# Patient Record
Sex: Male | Born: 1955 | Race: Black or African American | Hispanic: No | Marital: Married | Smoking: Never smoker
Health system: Southern US, Community
[De-identification: ages and names within clinical notes are randomized; demographics above are authoritative.]

## PROBLEM LIST (undated history)

## (undated) DIAGNOSIS — I10 Essential (primary) hypertension: Secondary | ICD-10-CM

## (undated) HISTORY — PX: KNEE CARTILAGE SURGERY: SHX688

## (undated) HISTORY — PX: INNER EAR SURGERY: SHX679

---

## 2005-05-06 ENCOUNTER — Encounter: Admission: RE | Admit: 2005-05-06 | Discharge: 2005-05-06 | Payer: Self-pay | Admitting: Otolaryngology

## 2007-02-19 IMAGING — CT CT ORBIT/TEMPORAL/IAC W/O CM
4 of 8 series · 8 of 30 positions shown, 9 images · IV contrast (agent unspecified)
Comparison: None.

CLINICAL DATA: Three months right attic cholesteatoma. 
CT TEMPORAL BONE WITHOUT CONTRAST:
TECHNIQUE: Axial and coronal plane CT imaging of the petrous temporal bones was performed with thin-collimation image reconstruction.  No intravenous contrast was administered.

[Series 3: recon 2: axial/check for recons · axial · 0.19mm/px · z∈[-11,+9]mm · 2 of 96 slices shown]
[im 32/96  bone]
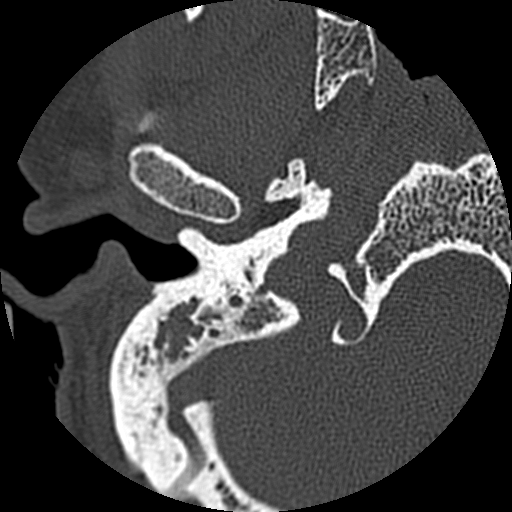
[im 64/96  bone]
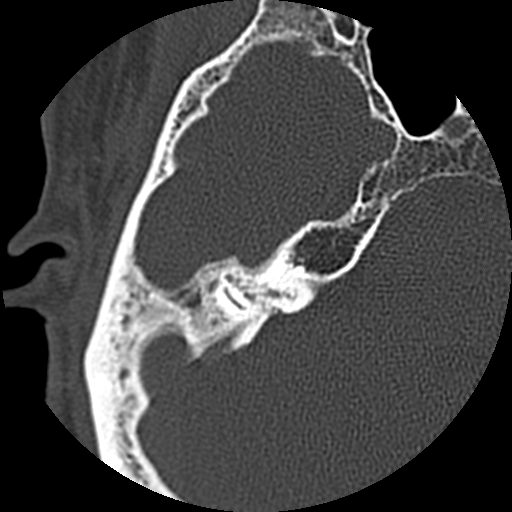

[Series 6: coronal/check recons 2 & 3 · axial · 0.33mm/px · z∈[+44,+67]mm · 2 of 96 slices shown, 3 images]
[im 32/96  brain]
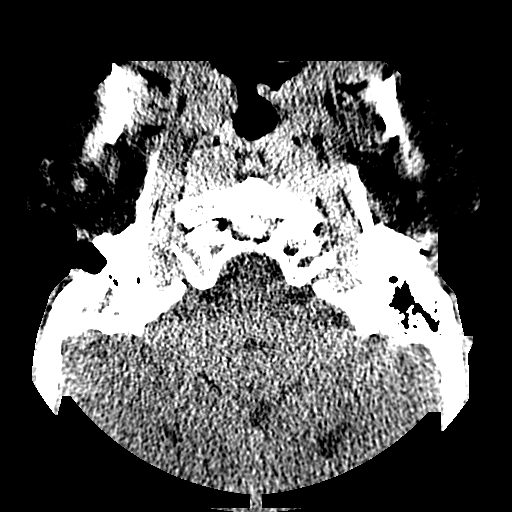
[im 32/96  bone]
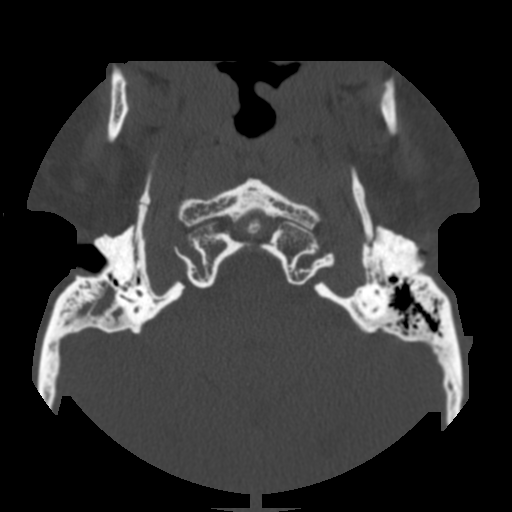
[im 64/96  bone]
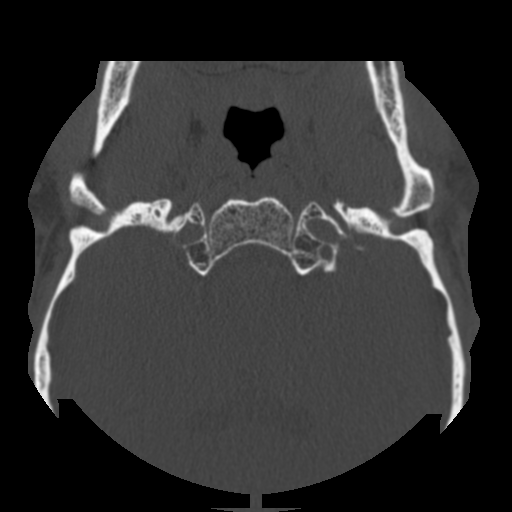

[Series 7: recon 2: coronal/check recons 2 · axial · 0.19mm/px · z∈[+62,+85]mm · 2 of 96 slices shown]
[im 32/96  bone]
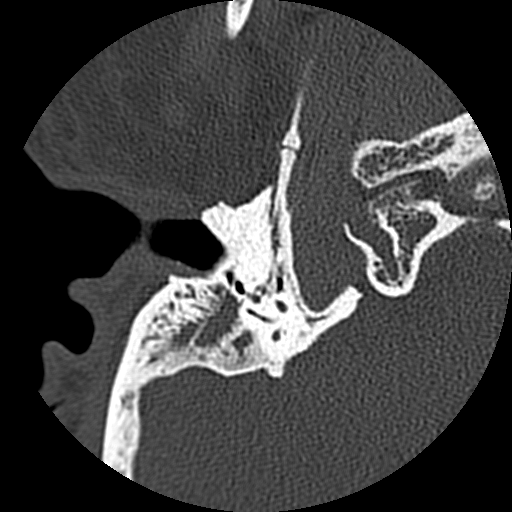
[im 64/96  bone]
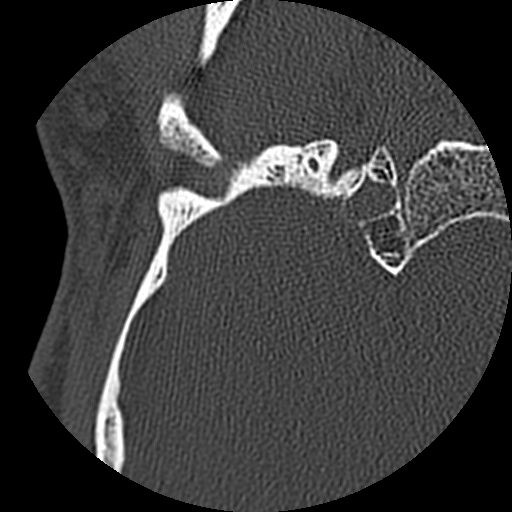

[Series 8: supine coronal · axial · 0.19mm/px · z∈[+63,+86]mm · 2 of 96 slices shown]
[im 32/96  bone]
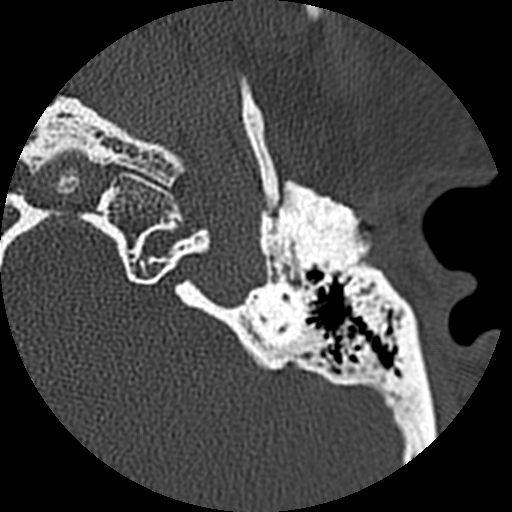
[im 64/96  bone]
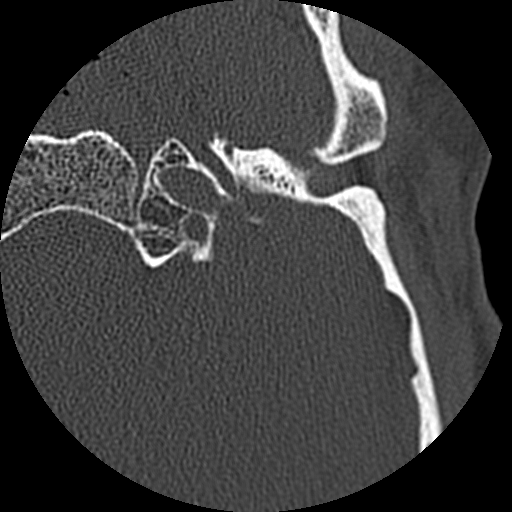

[8 of 30 positions shown; findings below may reference images not displayed]

FINDINGS: Abnormal soft tissue density is seen adjacent to the superolateral right ossicles which appear intact without demineralization.  There is slight osseous erosion at the anterior scutum with mucosal thickening in this region continuing to the adjacent external auditory canal consistent with slight changes of acquired cholesteatoma.  Soft tissue density continues confluently along the right aditus ad antrum with complete opacification of right mastoid air cells to include the mastoid antrum.  Tegmen appears intact with no other osseous erosion noted.  Specifically the right tympanic sinus appears clear.  Additional slight mucosal thickening is seen medially at the right middle ear cavity along the promontory.  The right tympanic membrane appears thickened superiorly.  Structures of the left petrous temporal bone, bilateral otic capsule structures, and bilateral descending facial nerve canals appear intact.  No other significant abnormality is seen.
IMPRESSION: Chronic right otomastoiditis with right anterior scutum osseous erosion consistent with acquired cholesteatoma.  No other osseous erosion is seen.

## 2008-07-29 ENCOUNTER — Ambulatory Visit (HOSPITAL_COMMUNITY): Admission: RE | Admit: 2008-07-29 | Discharge: 2008-07-29 | Payer: Self-pay | Admitting: *Deleted

## 2010-09-01 NOTE — Op Note (Signed)
NAMEMICAL, KICKLIGHTER                ACCOUNT NO.:  [id number]   MEDICAL RECORD NO.:  [id number]          PATIENT TYPE:  AMB   LOCATION:  ENDO                         FACILITY:  [facility]   PHYSICIAN:  Georgiana Spinner, M.D.    DATE OF BIRTH:  1955/04/29   DATE OF PROCEDURE:  DATE OF DISCHARGE:                               OPERATIVE REPORT   PROCEDURE:  Colonoscopy.   INDICATIONS:  Colon cancer screening.   ANESTHESIA:  Fentanyl 100 mcg, Versed 10 mg.   PROCEDURE:  With the patient mildly sedated in the left lateral  decubitus position a rectal exam was performed which was unremarkable,  but limited.  Subsequently, the Pentax videoscopic 3.2 colonoscope was  inserted in the rectum, passed under direct vision to the cecum  identified by ileocecal valve and appendiceal orifice, both of which  were photographed.  From this point, the colonoscope was slowly  withdrawn taking circumferential views of the colonic mucosa, stopping  in the rectum which appeared normal on direct and showed hemorrhoids on  retroflexed view.  The endoscope was straightened and withdrawn.  The  patient's vital signs and pulse oximeter remained stable.  The patient  tolerated the procedure well without apparent complication.   FINDINGS:  Hemorrhoids, but otherwise unremarkable examination.   PLAN:  To have the patient follow-up with me in 5 years or as needed.           ______________________________  Georgiana Spinner, M.D.     GMO/MEDQ  D:  07/29/2008  T:  07/29/2008  Job:  [id number]

## 2014-06-27 ENCOUNTER — Emergency Department (HOSPITAL_COMMUNITY)
Admission: EM | Admit: 2014-06-27 | Discharge: 2014-06-28 | Disposition: A | Discharge status: Home / Self Care | Payer: BLUE CROSS/BLUE SHIELD | Attending: Emergency Medicine | Admitting: Emergency Medicine

## 2014-06-27 ENCOUNTER — Encounter (HOSPITAL_COMMUNITY): Payer: Self-pay

## 2014-06-27 DIAGNOSIS — M7661 Achilles tendinitis, right leg: Secondary | ICD-10-CM | POA: Diagnosis not present

## 2014-06-27 DIAGNOSIS — M791 Myalgia: Secondary | ICD-10-CM | POA: Diagnosis not present

## 2014-06-27 DIAGNOSIS — M79604 Pain in right leg: Secondary | ICD-10-CM | POA: Diagnosis present

## 2014-06-27 NOTE — ED Notes (Signed)
Pt reporting right leg that has been present for several days.  Pt denies any swelling, tightness or redness.  Sts he recently had knee problems and had some fluid drained from it. Ambulatory in triage.

## 2014-06-27 NOTE — ED Notes (Signed)
Pt reports pain in rt leg that has been going on for about 3 weeks, worsened today. States some tingling in foot and pain that radiates to his hip. Pt states he had a cortisone shot and fluid drained off of his rt knee two days ago.

## 2014-06-27 NOTE — ED Provider Notes (Signed)
CSN: [id number]     Arrival date & time 06/27/14  2106 History   This chart was scribed for non-physician practitioner, Jinny SandersJoseph Nannie Starzyk, PA-C,  working with Tilden FossaElizabeth Rees, MD by Freida Busmaniana Omoyeni, ED Scribe. This patient was seen in room TR07C/TR07C and the patient's care was started at 12:00 AM.    Chief Complaint  Patient presents with  . Leg Pain    The history is provided by the patient. No language interpreter was used.     HPI Comments:  Timothy Kramer is a 59 y.o. male who presents to the Emergency Department complaining of intermittent RLE pain that waxes and wanes in severity. He first noticed this pain about 2-3 weeks ago a few hours after returning from a run but denies injury. He states the majority of his pain has been below the calf but reports occasional pain to his right hip. He also notes mild numbness to his right foot. His pain ranges from 3-8/10. No alleviating factors noted. He denies recent use of abx, recent travel, long periods of immobilization, smoking hx or h/o blood clots.  Pt had fluid drained from his right knee about 2 days ago; states knee pain has improved. He has a follow up MRI scheduled for 07/06/14.   History reviewed. No pertinent past medical history. Past Surgical History  Procedure Laterality Date  . Inner ear surgery     History reviewed. No pertinent family history. History  Substance Use Topics  . Smoking status: Never Smoker   . Smokeless tobacco: Not on file  . Alcohol Use: Yes     Comment: ocassionaly     Review of Systems  Constitutional: Negative for fever.  Musculoskeletal: Positive for myalgias and arthralgias.  Neurological: Negative for weakness and numbness.      Allergies  Review of patient's allergies indicates not on file.  Home Medications   Prior to Admission medications   Not on File   BP 130/77 mmHg  Pulse 88  Temp(Src) 98.2 F (36.8 C) (Oral)  Resp 18  Ht 6\' 1"  (1.854 m)  Wt 230 lb (104.327 kg)  BMI 30.35 kg/m2   SpO2 100% Physical Exam  Constitutional: He is oriented to person, place, and time. He appears well-developed and well-nourished. No distress.  HENT:  Head: Normocephalic and atraumatic.  Eyes: Right eye exhibits no discharge. Left eye exhibits no discharge. No scleral icterus.  Neck: Normal range of motion.  Cardiovascular: Normal rate.   Pulmonary/Chest: Effort normal. No respiratory distress.  Abdominal: He exhibits no distension.  Musculoskeletal: Normal range of motion. He exhibits no edema.  On examination of patient's lower leg there is no erythema, edema, warmth. Mild tenderness to palpation of distal portion of gastroc, with tenderness noted throughout patient's Achilles tendon, mostly distally towards his heel. Thompson test negative. Patient has normal range of motion of ankle with mild pain throughout Achilles tendon with dorsiflexion and forced dorsiflexion. Patient has 5 out of 5 motor strength at hip, knee, ankle. DP pulse 2+. Capillary refill less than 2 seconds distally. Distal sensation intact.   Neurological: He is alert and oriented to person, place, and time.  Skin: Skin is warm and dry. He is not diaphoretic. No erythema.  Psychiatric: He has a normal mood and affect.  Nursing note and vitals reviewed.   ED Course  Procedures   DIAGNOSTIC STUDIES:  Oxygen Saturation is 00% on RA, normal by my interpretation.    COORDINATION OF CARE:  12:05 AM Advised pt to rest  apply ice and follow up with ortho. Discussed treatment plan with pt at bedside and pt agreed to plan.  Labs Review Labs Reviewed - No data to display  Imaging Review No results found.   EKG Interpretation None      MDM   Final diagnoses:  Right Achilles tendinitis    Patient's signs and symptoms consistent with a Achilles tendinitis. Patient denying recent injury, states his pain has been gradual over the past 2-3 weeks, no concern for Achilles tendon rupture. Thompson test negative.  Wells criteria for DVT negative, no concern for DVT, infection or cellulitis. Offered patient radiograph to further classify if there was an acute fracture, however patient states he did not believe this is necessary at this time. I discharged patient to have him follow-up with his primary care physician and his orthopedist. Encouraged RICE therapy and use of Tylenol for pain and discomfort. I discussed return precautions with patient, and patient verbalizes understanding and agreement of this plan. I encouraged patient to call or return to the ER should he have any questions or concerns.  I personally performed the services described in this documentation, which was scribed in my presence. The recorded information has been reviewed and is accurate.  BP 130/77 mmHg  Pulse 88  Temp(Src) 98.2 F (36.8 C) (Oral)  Resp 18  Ht  (1.854 m)  Wt 230 lb (104.327 kg)  BMI 30.35 kg/m2  SpO2 100%  Signed,  Ladona Mow, PA-C 2:51 AM    Ladona Mow, PA-C 06/28/14 0251  Tilden Fossa, MD 07/02/14 [phone]

## 2014-06-28 NOTE — Discharge Instructions (Signed)
Achilles Tendinitis Achilles tendinitis is inflammation of the tough, cord-like band that attaches the lower muscles of your leg to your heel (Achilles tendon). It is usually caused by overusing the tendon and joint involved.  CAUSES Achilles tendinitis can happen because of:  A sudden increase in exercise or activity (such as running).  Doing the same exercises or activities (such as jumping) over and over.  Not warming up calf muscles before exercising.  Exercising in shoes that are worn out or not made for exercise.  Having arthritis or a bone growth on the back of the heel bone. This can rub against the tendon and hurt the tendon. SIGNS AND SYMPTOMS The most common symptoms are:  Pain in the back of the leg, just above the heel. The pain usually gets worse with exercise and better with rest.  Stiffness or soreness in the back of the leg, especially in the morning.  Swelling of the skin over the Achilles tendon.  Trouble standing on tiptoe. Sometimes, an Achilles tendon tears (ruptures). Symptoms of an Achilles tendon rupture can include:  Sudden, severe pain in the back of the leg.  Trouble putting weight on the foot or walking normally. DIAGNOSIS Achilles tendinitis will be diagnosed based on symptoms and a physical examination. An X-ray may be done to check if another condition is causing your symptoms. An MRI may be ordered if your health care provider suspects you may have completely torn your tendon, which is called an Achilles tendon rupture.  TREATMENT  Achilles tendinitis usually gets better over time. It can take weeks to months to heal completely. Treatment focuses on treating the symptoms and helping the injury heal. HOME CARE INSTRUCTIONS   Rest your Achilles tendon and avoid activities that cause pain.  Apply ice to the injured area:  Put ice in a plastic bag.  Place a towel between your skin and the bag.  Leave the ice on for 20 minutes, 2-3 times a  day  Try to avoid using the tendon (other than gentle range of motion) while the tendon is painful. Do not resume use until instructed by your health care provider. Then begin use gradually. Do not increase use to the point of pain. If pain does develop, decrease use and continue the above measures. Gradually increase activities that do not cause discomfort until you achieve normal use.  Do exercises to make your calf muscles stronger and more flexible. Your health care provider or physical therapist can recommend exercises for you to do.  Wrap your ankle with an elastic bandage or other wrap. This can help keep your tendon from moving too much. Your health care provider will show you how to wrap your ankle correctly.  Only take over-the-counter or prescription medicines for pain, discomfort, or fever as directed by your health care provider. SEEK MEDICAL CARE IF:   Your pain and swelling increase or pain is uncontrolled with medicines.  You develop new, unexplained symptoms or your symptoms get worse.  You are unable to move your toes or foot.  You develop warmth and swelling in your foot.  You have an unexplained temperature. MAKE SURE YOU:   Understand these instructions.  Will watch your condition.  Will get help right away if you are not doing well or get worse. Document Released: 01/13/2005 Document Revised: 01/24/2013 Document Reviewed: 11/15/2012 ExitCare Patient Information 2015 [employer]. This information is not intended to replace advice given to you by your health care provider. Make sure you discuss   any questions you have with your health care provider.  

## 2015-09-16 DIAGNOSIS — Z713 Dietary counseling and surveillance: Secondary | ICD-10-CM | POA: Diagnosis not present

## 2016-01-02 DIAGNOSIS — M62838 Other muscle spasm: Secondary | ICD-10-CM | POA: Diagnosis not present

## 2016-01-29 DIAGNOSIS — H524 Presbyopia: Secondary | ICD-10-CM | POA: Diagnosis not present

## 2016-02-10 DIAGNOSIS — Z23 Encounter for immunization: Secondary | ICD-10-CM | POA: Diagnosis not present

## 2016-06-18 DIAGNOSIS — Z125 Encounter for screening for malignant neoplasm of prostate: Secondary | ICD-10-CM | POA: Diagnosis not present

## 2016-06-18 DIAGNOSIS — R350 Frequency of micturition: Secondary | ICD-10-CM | POA: Diagnosis not present

## 2016-06-18 DIAGNOSIS — I10 Essential (primary) hypertension: Secondary | ICD-10-CM | POA: Diagnosis not present

## 2016-08-17 DIAGNOSIS — M545 Low back pain: Secondary | ICD-10-CM | POA: Diagnosis not present

## 2017-01-31 DIAGNOSIS — H524 Presbyopia: Secondary | ICD-10-CM | POA: Diagnosis not present

## 2017-02-08 DIAGNOSIS — Z23 Encounter for immunization: Secondary | ICD-10-CM | POA: Diagnosis not present

## 2017-02-09 DIAGNOSIS — Z0000 Encounter for general adult medical examination without abnormal findings: Secondary | ICD-10-CM | POA: Diagnosis not present

## 2017-02-09 DIAGNOSIS — I10 Essential (primary) hypertension: Secondary | ICD-10-CM | POA: Diagnosis not present

## 2017-02-09 DIAGNOSIS — Z125 Encounter for screening for malignant neoplasm of prostate: Secondary | ICD-10-CM | POA: Diagnosis not present

## 2017-03-14 DIAGNOSIS — R944 Abnormal results of kidney function studies: Secondary | ICD-10-CM | POA: Diagnosis not present

## 2017-09-14 DIAGNOSIS — S76012A Strain of muscle, fascia and tendon of left hip, initial encounter: Secondary | ICD-10-CM | POA: Diagnosis not present

## 2017-10-11 DIAGNOSIS — M170 Bilateral primary osteoarthritis of knee: Secondary | ICD-10-CM | POA: Diagnosis not present

## 2017-10-11 DIAGNOSIS — N400 Enlarged prostate without lower urinary tract symptoms: Secondary | ICD-10-CM | POA: Diagnosis not present

## 2017-10-11 DIAGNOSIS — R351 Nocturia: Secondary | ICD-10-CM | POA: Diagnosis not present

## 2018-02-02 DIAGNOSIS — H524 Presbyopia: Secondary | ICD-10-CM | POA: Diagnosis not present

## 2018-02-08 DIAGNOSIS — Z23 Encounter for immunization: Secondary | ICD-10-CM | POA: Diagnosis not present

## 2018-02-23 DIAGNOSIS — Z125 Encounter for screening for malignant neoplasm of prostate: Secondary | ICD-10-CM | POA: Diagnosis not present

## 2018-02-23 DIAGNOSIS — Z0000 Encounter for general adult medical examination without abnormal findings: Secondary | ICD-10-CM | POA: Diagnosis not present

## 2018-02-23 DIAGNOSIS — N400 Enlarged prostate without lower urinary tract symptoms: Secondary | ICD-10-CM | POA: Diagnosis not present

## 2018-02-23 DIAGNOSIS — I10 Essential (primary) hypertension: Secondary | ICD-10-CM | POA: Diagnosis not present

## 2018-03-14 DIAGNOSIS — R748 Abnormal levels of other serum enzymes: Secondary | ICD-10-CM | POA: Diagnosis not present

## 2018-12-01 DIAGNOSIS — M25561 Pain in right knee: Secondary | ICD-10-CM | POA: Diagnosis not present

## 2018-12-01 DIAGNOSIS — N400 Enlarged prostate without lower urinary tract symptoms: Secondary | ICD-10-CM | POA: Diagnosis not present

## 2018-12-01 DIAGNOSIS — I10 Essential (primary) hypertension: Secondary | ICD-10-CM | POA: Diagnosis not present

## 2019-01-01 DIAGNOSIS — R350 Frequency of micturition: Secondary | ICD-10-CM | POA: Diagnosis not present

## 2019-01-01 DIAGNOSIS — N401 Enlarged prostate with lower urinary tract symptoms: Secondary | ICD-10-CM | POA: Diagnosis not present

## 2019-01-01 DIAGNOSIS — R351 Nocturia: Secondary | ICD-10-CM | POA: Diagnosis not present

## 2019-01-31 DIAGNOSIS — Z23 Encounter for immunization: Secondary | ICD-10-CM | POA: Diagnosis not present

## 2019-02-08 DIAGNOSIS — R350 Frequency of micturition: Secondary | ICD-10-CM | POA: Diagnosis not present

## 2019-02-08 DIAGNOSIS — N401 Enlarged prostate with lower urinary tract symptoms: Secondary | ICD-10-CM | POA: Diagnosis not present

## 2019-02-08 DIAGNOSIS — R351 Nocturia: Secondary | ICD-10-CM | POA: Diagnosis not present

## 2019-02-09 DIAGNOSIS — H52221 Regular astigmatism, right eye: Secondary | ICD-10-CM | POA: Diagnosis not present

## 2019-02-09 DIAGNOSIS — H5202 Hypermetropia, left eye: Secondary | ICD-10-CM | POA: Diagnosis not present

## 2019-02-09 DIAGNOSIS — H524 Presbyopia: Secondary | ICD-10-CM | POA: Diagnosis not present

## 2019-02-20 DIAGNOSIS — N401 Enlarged prostate with lower urinary tract symptoms: Secondary | ICD-10-CM | POA: Diagnosis not present

## 2019-02-20 DIAGNOSIS — R350 Frequency of micturition: Secondary | ICD-10-CM | POA: Diagnosis not present

## 2019-03-07 DIAGNOSIS — N401 Enlarged prostate with lower urinary tract symptoms: Secondary | ICD-10-CM | POA: Diagnosis not present

## 2019-03-07 DIAGNOSIS — R351 Nocturia: Secondary | ICD-10-CM | POA: Diagnosis not present

## 2019-03-22 ENCOUNTER — Other Ambulatory Visit: Payer: Self-pay | Admitting: Internal Medicine

## 2019-03-22 ENCOUNTER — Ambulatory Visit
Admission: RE | Admit: 2019-03-22 | Discharge: 2019-03-22 | Disposition: A | Discharge status: Home / Self Care | Payer: BC Managed Care – PPO | Source: Ambulatory Visit | Attending: Internal Medicine | Admitting: Internal Medicine

## 2019-03-22 DIAGNOSIS — Z125 Encounter for screening for malignant neoplasm of prostate: Secondary | ICD-10-CM | POA: Diagnosis not present

## 2019-03-22 DIAGNOSIS — M25561 Pain in right knee: Secondary | ICD-10-CM

## 2019-03-22 DIAGNOSIS — R001 Bradycardia, unspecified: Secondary | ICD-10-CM | POA: Diagnosis not present

## 2019-03-22 DIAGNOSIS — Z0000 Encounter for general adult medical examination without abnormal findings: Secondary | ICD-10-CM | POA: Diagnosis not present

## 2019-03-22 DIAGNOSIS — I10 Essential (primary) hypertension: Secondary | ICD-10-CM | POA: Diagnosis not present

## 2019-03-23 ENCOUNTER — Other Ambulatory Visit (HOSPITAL_COMMUNITY): Payer: Self-pay | Admitting: Internal Medicine

## 2019-03-23 DIAGNOSIS — R9431 Abnormal electrocardiogram [ECG] [EKG]: Secondary | ICD-10-CM

## 2019-03-26 ENCOUNTER — Other Ambulatory Visit (HOSPITAL_COMMUNITY): Payer: BC Managed Care – PPO

## 2019-03-27 ENCOUNTER — Ambulatory Visit (HOSPITAL_COMMUNITY): Payer: BC Managed Care – PPO | Attending: Cardiology

## 2019-03-27 ENCOUNTER — Other Ambulatory Visit: Payer: Self-pay

## 2019-03-27 DIAGNOSIS — R9431 Abnormal electrocardiogram [ECG] [EKG]: Secondary | ICD-10-CM | POA: Insufficient documentation

## 2019-04-04 ENCOUNTER — Other Ambulatory Visit: Payer: Self-pay

## 2019-04-04 ENCOUNTER — Ambulatory Visit (INDEPENDENT_AMBULATORY_CARE_PROVIDER_SITE_OTHER): Payer: BC Managed Care – PPO | Admitting: Urology

## 2019-04-04 ENCOUNTER — Encounter: Payer: Self-pay | Admitting: Urology

## 2019-04-04 VITALS — BP 156/81 | HR 60 | Ht 76.0 in | Wt 220.0 lb

## 2019-04-04 DIAGNOSIS — N400 Enlarged prostate without lower urinary tract symptoms: Secondary | ICD-10-CM | POA: Diagnosis not present

## 2019-04-04 DIAGNOSIS — R350 Frequency of micturition: Secondary | ICD-10-CM | POA: Diagnosis not present

## 2019-04-04 DIAGNOSIS — N138 Other obstructive and reflux uropathy: Secondary | ICD-10-CM | POA: Diagnosis not present

## 2019-04-04 DIAGNOSIS — N401 Enlarged prostate with lower urinary tract symptoms: Secondary | ICD-10-CM

## 2019-04-04 LAB — URINALYSIS, COMPLETE
Bilirubin, UA: NEGATIVE
Glucose, UA: NEGATIVE
Ketones, UA: NEGATIVE
Leukocytes,UA: NEGATIVE
Nitrite, UA: NEGATIVE
Protein,UA: NEGATIVE
RBC, UA: NEGATIVE
Specific Gravity, UA: 1.02 (ref 1.005–1.030)
Urobilinogen, Ur: 0.2 mg/dL (ref 0.2–1.0)
pH, UA: 6 (ref 5.0–7.5)

## 2019-04-04 LAB — MICROSCOPIC EXAMINATION
Bacteria, UA: NONE SEEN
Epithelial Cells (non renal): NONE SEEN /hpf (ref 0–10)
RBC: NONE SEEN /hpf (ref 0–2)

## 2019-04-04 LAB — BLADDER SCAN AMB NON-IMAGING: Scan Result: 16

## 2019-04-04 NOTE — Progress Notes (Signed)
04/04/2019 12:22 PM   Timothy Kramer [birth year] [id number]  Referring provider: Seward Carol, MD [place]. [employer] [address],  [place] [address]  Chief Complaint  Patient presents with  . Benign Prostatic Hypertrophy    HPI: 63 year old male with personal history of BPH/prostamegaly referred by Louis Meckel for consideration of holmium laser enucleation of the prostate.  The patient has had poorly controlled and worsening urinary symptoms over the past several years.  He has been on Flomax for many years.  Recently, he was tried on Myrbetriq which he was unable to tolerate.  He is now interested in surgical intervention.  As part of his evaluation, he underwent prostate sizing which revealed 175 g prostate.  Please see records from [facility] urology for details.  Most recent PSA 2.93 on 02/2018.  This was repeats last week by his PCP and remains stable.  Records from [facility] urology reviewed extensively today.   IPSS    Row Name 04/04/19 1000         International Prostate Symptom Score   How often have you had the sensation of not emptying your bladder?  About half the time     How often have you had to urinate less than every two hours?  About half the time     How often have you found you stopped and started again several times when you urinated?  About half the time     How often have you found it difficult to postpone urination?  More than half the time     How often have you had a weak urinary stream?  More than half the time     How often have you had to strain to start urination?  About half the time     How many times did you typically get up at night to urinate?  2 Times     Total IPSS Score  22       Quality of Life due to urinary symptoms   If you were to spend the rest of your life with your urinary condition just the way it is now how would you feel about that?  Unhappy         PMH: No past medical history on file.  Surgical History: Past  Surgical History:  Procedure Laterality Date  . INNER EAR SURGERY      Home Medications:  Allergies as of 04/04/2019   No Known Allergies     Medication List       Accurate as of April 04, 2019 12:22 PM. If you have any questions, ask your nurse or doctor.        atenolol 50 MG tablet Commonly known as: TENORMIN Take 50 mg by mouth daily.   tamsulosin 0.4 MG Caps capsule Commonly known as: FLOMAX Take 0.8 mg by mouth daily.       Allergies: No Known Allergies  Family History: No family history on file.  Social History:  reports that he has never smoked. He has never used smokeless tobacco. He reports current alcohol use. No history on file for drug.  ROS: UROLOGY Frequent Urination?: Yes Hard to postpone urination?: Yes Burning/pain with urination?: No Get up at night to urinate?: Yes Leakage of urine?: No Urine stream starts and stops?: Yes Trouble starting stream?: No Do you have to strain to urinate?: No Blood in urine?: No Urinary tract infection?: No Sexually transmitted disease?: No Injury to kidneys or bladder?: No Painful intercourse?: No  Weak stream?: Yes Erection problems?: No Penile pain?: No  Gastrointestinal Nausea?: No Vomiting?: No Indigestion/heartburn?: No Diarrhea?: No Constipation?: No  Constitutional Fever: No Night sweats?: No Weight loss?: No Fatigue?: No  Skin Skin rash/lesions?: No Itching?: No  Eyes Blurred vision?: No Double vision?: No  Ears/Nose/Throat Sore throat?: No Sinus problems?: No  Hematologic/Lymphatic Swollen glands?: No Easy bruising?: No  Cardiovascular Leg swelling?: No Chest pain?: No  Respiratory Cough?: No Shortness of breath?: No  Endocrine Excessive thirst?: No  Musculoskeletal Back pain?: No Joint pain?: No  Neurological Headaches?: No Dizziness?: No  Psychologic Depression?: No Anxiety?: No  Physical Exam: BP (!) 156/81   Pulse 60   Ht 6\' 4"  (1.93 m)   Wt  220 lb (99.8 kg)   BMI 26.78 kg/m   Constitutional:  Alert and oriented, No acute distress. HEENT: [place] AT, moist mucus membranes.  Trachea midline, no masses. Cardiovascular: No clubbing, cyanosis, or edema. Respiratory: Normal respiratory effort, no increased work of breathing.. Skin: No rashes, bruises or suspicious lesions. Neurologic: Grossly intact, no focal deficits, moving all 4 extremities. Psychiatric: Normal mood and affect.  Laboratory Data: Labs as above  Results for orders placed or performed in visit on 04/04/19  Microscopic Examination   URINE  Result Value Ref Range   WBC, UA 0-5 0 - 5 /hpf   RBC None seen 0 - 2 /hpf   Epithelial Cells (non renal) None seen 0 - 10 /hpf   Bacteria, UA None seen None seen/Few  Urinalysis, Complete  Result Value Ref Range   Specific Gravity, UA 1.020 1.005 - 1.030   pH, UA 6.0 5.0 - 7.5   Color, UA Yellow Yellow   Appearance Ur Clear Clear   Leukocytes,UA Negative Negative   Protein,UA Negative Negative/Trace   Glucose, UA Negative Negative   Ketones, UA Negative Negative   RBC, UA Negative Negative   Bilirubin, UA Negative Negative   Urobilinogen, Ur 0.2 0.2 - 1.0 mg/dL   Nitrite, UA Negative Negative   Microscopic Examination See below:   Bladder Scan (Post Void Residual) in office  Result Value Ref Range   Scan Result 16     Assessment & Plan:    1. BPH with urinary obstruction Significant prostamegaly, 175 g with both obstructive and irritative voiding symptoms which are poorly controlled on Flomax alone  Patient is considering surgical intervention and primarily presents today to discuss holmium laser enucleation of the prostate.  Alternatives including simple prostatectomy versus robotic simple prostatectomy reviewed along with the differences between each of the procedures.  We discussed alternatives including TURP vs. holmium laser enucleation of the prostate vs. greenlight laser ablation. Differences between the  surgical procedures were discussed as well as the risks and benefits of each. He is most interested in HoLEP.  We discussed the common postoperative course following holep including need for overnight Foley catheter, temporary worsening of irritative voiding symptoms, and occasional stress incontinence which typically lasts up to 6 months but can persist. We discussed retrograde ejaculation and damage to surrounding structures including the urinary sphincter. Other uncommon complications including hematuria and urinary tract infection.  He understands all of the above and is willing to proceed as planned.  - Urinalysis, Complete - Bladder Scan (Post Void Residual) in office - Urine Culture, Comprehensive  2. Urinary frequency We discussed that although his irritative voiding symptoms may be related to outlet obstruction, he may also have Denovo bladder overactivity which can be managed medically.  He understands  that the above procedure may or may not help this issue.  All questions answered.   [name], MD  [facility] [address], [address] [place], [place] [address] [phone]

## 2019-04-04 NOTE — Patient Instructions (Signed)

## 2019-04-05 ENCOUNTER — Other Ambulatory Visit: Payer: Self-pay | Admitting: Radiology

## 2019-04-05 DIAGNOSIS — N401 Enlarged prostate with lower urinary tract symptoms: Secondary | ICD-10-CM

## 2019-04-05 DIAGNOSIS — N138 Other obstructive and reflux uropathy: Secondary | ICD-10-CM

## 2019-04-10 LAB — CULTURE, URINE COMPREHENSIVE

## 2019-04-23 ENCOUNTER — Other Ambulatory Visit: Payer: BC Managed Care – PPO

## 2019-04-24 ENCOUNTER — Encounter

## 2019-04-24 ENCOUNTER — Ambulatory Visit: Payer: Self-pay | Admitting: Urology

## 2019-04-25 ENCOUNTER — Ambulatory Visit: Payer: Self-pay | Admitting: Urology

## 2019-04-26 ENCOUNTER — Other Ambulatory Visit: Payer: BC Managed Care – PPO

## 2019-04-26 ENCOUNTER — Other Ambulatory Visit: Payer: Self-pay | Admitting: Radiology

## 2019-04-26 ENCOUNTER — Other Ambulatory Visit: Payer: Self-pay

## 2019-04-26 DIAGNOSIS — N138 Other obstructive and reflux uropathy: Secondary | ICD-10-CM

## 2019-04-26 DIAGNOSIS — N401 Enlarged prostate with lower urinary tract symptoms: Secondary | ICD-10-CM | POA: Diagnosis not present

## 2019-04-27 ENCOUNTER — Telehealth: Payer: Self-pay | Admitting: Urology

## 2019-04-27 ENCOUNTER — Encounter: Admission: RE | Admit: 2019-04-27 | Payer: BC Managed Care – PPO | Source: Ambulatory Visit

## 2019-04-27 LAB — URINALYSIS, COMPLETE
Bilirubin, UA: NEGATIVE
Glucose, UA: NEGATIVE
Ketones, UA: NEGATIVE
Leukocytes,UA: NEGATIVE
Nitrite, UA: NEGATIVE
Protein,UA: NEGATIVE
RBC, UA: NEGATIVE
Specific Gravity, UA: 1.015 (ref 1.005–1.030)
Urobilinogen, Ur: 0.2 mg/dL (ref 0.2–1.0)
pH, UA: 7 (ref 5.0–7.5)

## 2019-04-27 LAB — MICROSCOPIC EXAMINATION
Bacteria, UA: NONE SEEN
Epithelial Cells (non renal): NONE SEEN /hpf (ref 0–10)
RBC: NONE SEEN /hpf (ref 0–2)
WBC, UA: NONE SEEN /hpf (ref 0–5)

## 2019-04-27 NOTE — Telephone Encounter (Signed)
Pt would like a call back to discuss risks of surgery.

## 2019-04-27 NOTE — Telephone Encounter (Signed)
Discussed concerns with patient and he verbalized understanding

## 2019-04-29 LAB — CULTURE, URINE COMPREHENSIVE

## 2019-04-30 ENCOUNTER — Other Ambulatory Visit: Payer: Self-pay

## 2019-04-30 ENCOUNTER — Encounter
Admission: RE | Admit: 2019-04-30 | Discharge: 2019-04-30 | Disposition: A | Discharge status: Home / Self Care | Payer: BC Managed Care – PPO | Source: Ambulatory Visit | Attending: Urology | Admitting: Urology

## 2019-04-30 HISTORY — DX: Essential (primary) hypertension: I10

## 2019-04-30 NOTE — Patient Instructions (Signed)
Your procedure is scheduled on: 05/03/19 Report to DAY SURGERY DEPARTMENT LOCATED ON 2ND FLOOR MEDICAL MALL ENTRANCE. To find out your arrival time please call [phone] between 1PM - 3PM on 05/02/19.  Remember: Instructions that are not followed completely may result in serious medical risk, up to and including death, or upon the discretion of your surgeon and anesthesiologist your surgery may need to be rescheduled.     _X__ 1. Do not eat food after midnight the night before your procedure.                 No gum chewing or hard candies. You may drink clear liquids up to 2 hours                 before you are scheduled to arrive for your surgery- DO not drink clear                 liquids within 2 hours of the start of your surgery.                 Clear Liquids include:  water, apple juice without pulp, clear carbohydrate                 drink such as Clearfast or Gatorade, Black Coffee or Tea (Do not add                 anything to coffee or tea). Diabetics water only  __X__2.  On the morning of surgery brush your teeth with toothpaste and water, you                 may rinse your mouth with mouthwash if you wish.  Do not swallow any              toothpaste of mouthwash.     _X__ 3.  No Alcohol for 24 hours before or after surgery.   _X__ 4.  Do Not Smoke or use e-cigarettes For 24 Hours Prior to Your Surgery.                 Do not use any chewable tobacco products for at least 6 hours prior to                 surgery.  ____  5.  Bring all medications with you on the day of surgery if instructed.   __X__  6.  Notify your doctor if there is any change in your medical condition      (cold, fever, infections).     Do not wear jewelry, make-up, hairpins, clips or nail polish. Do not wear lotions, powders, or perfumes.  Do not shave 48 hours prior to surgery. Men may shave face and neck. Do not bring valuables to the hospital.    [facility] is not responsible for any belongings or  valuables.  Contacts, dentures/partials or body piercings may not be worn into surgery. Bring a case for your contacts, glasses or hearing aids, a denture cup will be supplied. Leave your suitcase in the car. After surgery it may be brought to your room. For patients admitted to the hospital, discharge time is determined by your treatment team.   Patients discharged the day of surgery will not be allowed to drive home.   Please read over the following fact sheets that you were given:     __X__ Take these medicines the morning of surgery with A SIP OF WATER:  1. amlodipine  2. atenolol  3. flomax  4.  5.  6.  ____ Fleet Enema (as directed)   ____ Use CHG Soap/SAGE wipes as directed  ____ Use inhalers on the day of surgery  ____ Stop metformin/Janumet/Farxiga 2 days prior to surgery    ____ Take 1/2 of usual insulin dose the night before surgery. No insulin the morning          of surgery.   ____ Stop Blood Thinners Coumadin/Plavix/Xarelto/Pleta/Pradaxa/Eliquis/Effient/Aspirin  on   Or contact your Surgeon, Cardiologist or Medical Doctor regarding  ability to stop your blood thinners  __X__ Stop Anti-inflammatories 7 days before surgery such as Advil, Ibuprofen, Motrin,  BC or Goodies Powder, Naprosyn, Naproxen, Aleve, Aspirin    __X__ Stop all herbal supplements, fish oil or vitamin E until after surgery.    ____ Bring C-Pap to the hospital.

## 2019-05-01 ENCOUNTER — Other Ambulatory Visit
Admission: RE | Admit: 2019-05-01 | Discharge: 2019-05-01 | Disposition: A | Discharge status: Home / Self Care | Payer: BC Managed Care – PPO | Source: Ambulatory Visit | Attending: Urology | Admitting: Urology

## 2019-05-01 DIAGNOSIS — Z20822 Contact with and (suspected) exposure to covid-19: Secondary | ICD-10-CM | POA: Insufficient documentation

## 2019-05-01 DIAGNOSIS — Z01812 Encounter for preprocedural laboratory examination: Secondary | ICD-10-CM | POA: Insufficient documentation

## 2019-05-01 LAB — SARS CORONAVIRUS 2 (TAT 6-24 HRS): SARS Coronavirus 2: NEGATIVE

## 2019-05-03 ENCOUNTER — Ambulatory Visit: Payer: BC Managed Care – PPO | Admitting: Registered Nurse

## 2019-05-03 ENCOUNTER — Encounter: Payer: Self-pay | Admitting: Urology

## 2019-05-03 ENCOUNTER — Ambulatory Visit
Admission: RE | Admit: 2019-05-03 | Discharge: 2019-05-03 | Disposition: A | Discharge status: Home / Self Care | Payer: BC Managed Care – PPO | Attending: Urology | Admitting: Urology

## 2019-05-03 ENCOUNTER — Encounter
Admission: RE | Disposition: A | Discharge status: Home / Self Care | Payer: Self-pay | Source: Home / Self Care | Attending: Urology

## 2019-05-03 ENCOUNTER — Other Ambulatory Visit: Payer: Self-pay

## 2019-05-03 DIAGNOSIS — N138 Other obstructive and reflux uropathy: Secondary | ICD-10-CM | POA: Diagnosis not present

## 2019-05-03 DIAGNOSIS — R350 Frequency of micturition: Secondary | ICD-10-CM | POA: Insufficient documentation

## 2019-05-03 DIAGNOSIS — N401 Enlarged prostate with lower urinary tract symptoms: Secondary | ICD-10-CM | POA: Insufficient documentation

## 2019-05-03 DIAGNOSIS — Z539 Procedure and treatment not carried out, unspecified reason: Secondary | ICD-10-CM | POA: Insufficient documentation

## 2019-05-03 DIAGNOSIS — Z79899 Other long term (current) drug therapy: Secondary | ICD-10-CM | POA: Insufficient documentation

## 2019-05-03 SURGERY — ENUCLEATION, PROSTATE, USING LASER, WITH MORCELLATION
Anesthesia: General

## 2019-05-03 MED ORDER — CEFAZOLIN SODIUM-DEXTROSE 2-4 GM/100ML-% IV SOLN: 2.0000 g | INTRAVENOUS | Status: DC

## 2019-05-03 MED ORDER — FAMOTIDINE 20 MG PO TABS: 20.0000 mg | ORAL_TABLET | Freq: Once | ORAL | Status: AC

## 2019-05-03 MED ORDER — FAMOTIDINE 20 MG PO TABS
ORAL_TABLET | ORAL | Status: AC
  Administered 2019-05-03: 20 mg via ORAL
  Filled 2019-05-03: qty 1

## 2019-05-03 MED ORDER — FENTANYL CITRATE (PF) 100 MCG/2ML IJ SOLN
INTRAMUSCULAR | Status: AC
  Filled 2019-05-03: qty 2

## 2019-05-03 MED ORDER — LIDOCAINE HCL (PF) 2 % IJ SOLN
INTRAMUSCULAR | Status: AC
  Filled 2019-05-03: qty 5

## 2019-05-03 MED ORDER — MIDAZOLAM HCL 2 MG/2ML IJ SOLN
INTRAMUSCULAR | Status: AC
  Filled 2019-05-03: qty 2

## 2019-05-03 MED ORDER — LACTATED RINGERS IV SOLN: INTRAVENOUS | Status: DC

## 2019-05-03 MED ORDER — CEFAZOLIN SODIUM-DEXTROSE 2-4 GM/100ML-% IV SOLN
INTRAVENOUS | Status: AC
  Filled 2019-05-03: qty 100

## 2019-05-03 SURGICAL SUPPLY — 33 items
ADAPTER IRRIG TUBE 2 SPIKE SOL (ADAPTER) ×6 IMPLANT
ADPR TBG 2 SPK PMP STRL ASCP (ADAPTER) ×2
BAG DRN LRG CPC RND TRDRP CNTR (MISCELLANEOUS)
BAG DRN RND TRDRP ANRFLXCHMBR (UROLOGICAL SUPPLIES)
BAG URINE DRAIN 2000ML AR STRL (UROLOGICAL SUPPLIES) IMPLANT
BAG URO DRAIN 4000ML (MISCELLANEOUS) IMPLANT
CATH FOL 2WAY LX 20X30 (CATHETERS) IMPLANT
CATH FOL 2WAY LX 22X30 (CATHETERS) IMPLANT
CATH FOLEY 3WAY 30CC 22FR (CATHETERS) IMPLANT
CATH URETL 5X70 OPEN END (CATHETERS) ×3 IMPLANT
CONTAINER COLLECT MORCELLATR (MISCELLANEOUS) ×2 IMPLANT
DRAPE 3/4 80X56 (DRAPES) ×3 IMPLANT
DRAPE UTILITY 15X26 TOWEL STRL (DRAPES) IMPLANT
FILTER OVERFLOW MORCELLATOR (FILTER) ×2 IMPLANT
GLOVE BIO SURGEON STRL SZ 6.5 (GLOVE) ×6 IMPLANT
GOWN STRL REUS W/ TWL LRG LVL3 (GOWN DISPOSABLE) ×4 IMPLANT
GOWN STRL REUS W/TWL LRG LVL3 (GOWN DISPOSABLE) ×4
HOLDER FOLEY CATH W/STRAP (MISCELLANEOUS) ×3 IMPLANT
KIT TURNOVER CYSTO (KITS) ×3 IMPLANT
LASER FIBER 550M SMARTSCOPE (Laser) ×3 IMPLANT
MORCELLATOR COLLECT CONTAINER (MISCELLANEOUS) ×2
MORCELLATOR OVERFLOW FILTER (FILTER) ×2
MORCELLATOR ROTATION 4.75 335 (MISCELLANEOUS) ×3 IMPLANT
PACK CYSTO AR (MISCELLANEOUS) ×3 IMPLANT
SET CYSTO W/LG BORE CLAMP LF (SET/KITS/TRAYS/PACK) IMPLANT
SET IRRIG Y TYPE TUR BLADDER L (SET/KITS/TRAYS/PACK) ×3 IMPLANT
SLEEVE PROTECTION STRL DISP (MISCELLANEOUS) ×6 IMPLANT
SOL .9 NS 3000ML IRR  AL (IV SOLUTION) ×4
SOL .9 NS 3000ML IRR AL (IV SOLUTION) ×4
SOL .9 NS 3000ML IRR UROMATIC (IV SOLUTION) ×8 IMPLANT
SYRINGE IRR TOOMEY STRL 70CC (SYRINGE) ×3 IMPLANT
TUBE PUMP MORCELLATOR PIRANHA (TUBING) ×3 IMPLANT
WATER STERILE IRR 1000ML POUR (IV SOLUTION) ×3 IMPLANT

## 2019-05-03 NOTE — Anesthesia Preprocedure Evaluation (Deleted)
Anesthesia Evaluation  Patient identified by MRN, date of birth, ID band Patient awake    Reviewed: Allergy & Precautions, NPO status , Patient's Chart, lab work & pertinent test results  History of Anesthesia Complications Negative for: history of anesthetic complications  Airway Mallampati: II       Dental   Pulmonary neg sleep apnea, neg COPD, Not current smoker,           Cardiovascular hypertension, Pt. on medications (-) Past MI and (-) CHF (-) dysrhythmias (-) Valvular Problems/Murmurs     Neuro/Psych neg Seizures    GI/Hepatic Neg liver ROS, neg GERD  ,  Endo/Other  neg diabetes  Renal/GU negative Renal ROS     Musculoskeletal   Abdominal   Peds  Hematology   Anesthesia Other Findings   Reproductive/Obstetrics                            Anesthesia Physical Anesthesia Plan  ASA: II  Anesthesia Plan: General   Post-op Pain Management:    Induction: Intravenous  PONV Risk Score and Plan: 2  Airway Management Planned: Oral ETT  Additional Equipment:   Intra-op Plan:   Post-operative Plan:   Informed Consent: I have reviewed the patients History and Physical, chart, labs and discussed the procedure including the risks, benefits and alternatives for the proposed anesthesia with the patient or authorized representative who has indicated his/her understanding and acceptance.       Plan Discussed with:   Anesthesia Plan Comments:         Anesthesia Quick Evaluation

## 2019-05-09 ENCOUNTER — Other Ambulatory Visit: Payer: Self-pay

## 2019-05-09 DIAGNOSIS — Z01818 Encounter for other preprocedural examination: Secondary | ICD-10-CM

## 2019-05-10 ENCOUNTER — Other Ambulatory Visit: Payer: Self-pay

## 2019-05-10 ENCOUNTER — Other Ambulatory Visit: Payer: BC Managed Care – PPO

## 2019-05-10 DIAGNOSIS — Z01818 Encounter for other preprocedural examination: Secondary | ICD-10-CM | POA: Diagnosis not present

## 2019-05-10 LAB — MICROSCOPIC EXAMINATION
Bacteria, UA: NONE SEEN
Epithelial Cells (non renal): NONE SEEN /hpf (ref 0–10)
RBC, Urine: NONE SEEN /hpf (ref 0–2)

## 2019-05-10 LAB — URINALYSIS, COMPLETE
Bilirubin, UA: NEGATIVE
Glucose, UA: NEGATIVE
Ketones, UA: NEGATIVE
Leukocytes,UA: NEGATIVE
Nitrite, UA: NEGATIVE
Protein,UA: NEGATIVE
RBC, UA: NEGATIVE
Specific Gravity, UA: 1.02 (ref 1.005–1.030)
Urobilinogen, Ur: 0.2 mg/dL (ref 0.2–1.0)
pH, UA: 7.5 (ref 5.0–7.5)

## 2019-05-14 ENCOUNTER — Other Ambulatory Visit: Payer: Self-pay | Admitting: Radiology

## 2019-05-14 DIAGNOSIS — N401 Enlarged prostate with lower urinary tract symptoms: Secondary | ICD-10-CM

## 2019-05-14 DIAGNOSIS — N138 Other obstructive and reflux uropathy: Secondary | ICD-10-CM

## 2019-05-15 ENCOUNTER — Other Ambulatory Visit
Admission: RE | Admit: 2019-05-15 | Discharge: 2019-05-15 | Disposition: A | Discharge status: Home / Self Care | Payer: BC Managed Care – PPO | Source: Ambulatory Visit | Attending: Urology | Admitting: Urology

## 2019-05-15 ENCOUNTER — Telehealth: Payer: Self-pay | Admitting: *Deleted

## 2019-05-15 ENCOUNTER — Other Ambulatory Visit: Payer: Self-pay

## 2019-05-15 DIAGNOSIS — Z20822 Contact with and (suspected) exposure to covid-19: Secondary | ICD-10-CM | POA: Diagnosis not present

## 2019-05-15 DIAGNOSIS — Z01812 Encounter for preprocedural laboratory examination: Secondary | ICD-10-CM | POA: Insufficient documentation

## 2019-05-15 LAB — CULTURE, URINE COMPREHENSIVE

## 2019-05-15 LAB — SARS CORONAVIRUS 2 (TAT 6-24 HRS): SARS Coronavirus 2: NEGATIVE

## 2019-05-15 MED ORDER — LEVOFLOXACIN 500 MG PO TABS: 500.0000 mg | ORAL_TABLET | Freq: Every day | ORAL | 0 refills | Status: DC

## 2019-05-15 NOTE — Telephone Encounter (Addendum)
Patient notified, verbalized understanding.  Sent in to [facility] as requested.   ----- Message from [name], MD sent at 05/15/2019 11:41 AM EST ----- Please treat this as precaution, please start levaquin 500 mg daily x 5 days (preop)  [name], MD

## 2019-05-17 ENCOUNTER — Ambulatory Visit: Payer: BC Managed Care – PPO

## 2019-05-17 ENCOUNTER — Ambulatory Visit
Admission: RE | Admit: 2019-05-17 | Discharge: 2019-05-17 | Disposition: A | Discharge status: Home / Self Care | Payer: BC Managed Care – PPO | Attending: Urology | Admitting: Urology

## 2019-05-17 ENCOUNTER — Encounter: Payer: Self-pay | Admitting: Urology

## 2019-05-17 ENCOUNTER — Other Ambulatory Visit: Payer: Self-pay

## 2019-05-17 ENCOUNTER — Encounter
Admission: RE | Disposition: A | Discharge status: Home / Self Care | Payer: Self-pay | Source: Home / Self Care | Attending: Urology

## 2019-05-17 DIAGNOSIS — Z79899 Other long term (current) drug therapy: Secondary | ICD-10-CM | POA: Diagnosis not present

## 2019-05-17 DIAGNOSIS — N320 Bladder-neck obstruction: Secondary | ICD-10-CM | POA: Insufficient documentation

## 2019-05-17 DIAGNOSIS — N401 Enlarged prostate with lower urinary tract symptoms: Secondary | ICD-10-CM | POA: Diagnosis not present

## 2019-05-17 DIAGNOSIS — I10 Essential (primary) hypertension: Secondary | ICD-10-CM | POA: Insufficient documentation

## 2019-05-17 DIAGNOSIS — N138 Other obstructive and reflux uropathy: Secondary | ICD-10-CM | POA: Diagnosis not present

## 2019-05-17 HISTORY — PX: HOLEP-LASER ENUCLEATION OF THE PROSTATE WITH MORCELLATION: SHX6641

## 2019-05-17 SURGERY — ENUCLEATION, PROSTATE, USING LASER, WITH MORCELLATION
Anesthesia: General | Site: Prostate

## 2019-05-17 MED ORDER — MIDAZOLAM HCL 2 MG/2ML IJ SOLN
INTRAMUSCULAR | Status: AC
  Filled 2019-05-17: qty 2

## 2019-05-17 MED ORDER — PROPOFOL 10 MG/ML IV BOLUS
INTRAVENOUS | Status: DC | PRN
  Administered 2019-05-17: 150 mg via INTRAVENOUS
  Administered 2019-05-17: 30 mg via INTRAVENOUS

## 2019-05-17 MED ORDER — DOCUSATE SODIUM 100 MG PO CAPS: 100.0000 mg | ORAL_CAPSULE | Freq: Two times a day (BID) | ORAL | 0 refills | Status: DC

## 2019-05-17 MED ORDER — OXYCODONE-ACETAMINOPHEN 5-325 MG PO TABS: 1.0000 | ORAL_TABLET | ORAL | 0 refills | Status: DC | PRN

## 2019-05-17 MED ORDER — FUROSEMIDE 10 MG/ML IJ SOLN
INTRAMUSCULAR | Status: DC | PRN
  Administered 2019-05-17: 10 mg via INTRAMUSCULAR

## 2019-05-17 MED ORDER — ONDANSETRON HCL 4 MG/2ML IJ SOLN: 4.0000 mg | Freq: Once | INTRAMUSCULAR | Status: DC | PRN

## 2019-05-17 MED ORDER — EPHEDRINE SULFATE 50 MG/ML IJ SOLN
INTRAMUSCULAR | Status: DC | PRN
  Administered 2019-05-17 (×2): 5 mg via INTRAVENOUS

## 2019-05-17 MED ORDER — DEXAMETHASONE SODIUM PHOSPHATE 10 MG/ML IJ SOLN
INTRAMUSCULAR | Status: DC | PRN
  Administered 2019-05-17: 10 mg via INTRAVENOUS

## 2019-05-17 MED ORDER — MIDAZOLAM HCL 2 MG/2ML IJ SOLN
INTRAMUSCULAR | Status: DC | PRN
  Administered 2019-05-17: 2 mg via INTRAVENOUS

## 2019-05-17 MED ORDER — SODIUM CHLORIDE 0.9 % IV SOLN
INTRAVENOUS | Status: DC | PRN
  Administered 2019-05-17: 20 ug/min via INTRAVENOUS

## 2019-05-17 MED ORDER — CEFAZOLIN SODIUM-DEXTROSE 2-4 GM/100ML-% IV SOLN
2.0000 g | INTRAVENOUS | Status: AC
  Administered 2019-05-17: 2 g via INTRAVENOUS

## 2019-05-17 MED ORDER — SUGAMMADEX SODIUM 200 MG/2ML IV SOLN
INTRAVENOUS | Status: DC | PRN
  Administered 2019-05-17: 199.6 mg via INTRAVENOUS

## 2019-05-17 MED ORDER — PHENYLEPHRINE HCL (PRESSORS) 10 MG/ML IV SOLN
INTRAVENOUS | Status: DC | PRN
  Administered 2019-05-17 (×4): 50 ug via INTRAVENOUS

## 2019-05-17 MED ORDER — FENTANYL CITRATE (PF) 100 MCG/2ML IJ SOLN
INTRAMUSCULAR | Status: AC
  Filled 2019-05-17: qty 2

## 2019-05-17 MED ORDER — ROCURONIUM BROMIDE 100 MG/10ML IV SOLN
INTRAVENOUS | Status: DC | PRN
  Administered 2019-05-17: 10 mg via INTRAVENOUS
  Administered 2019-05-17: 50 mg via INTRAVENOUS
  Administered 2019-05-17: 10 mg via INTRAVENOUS

## 2019-05-17 MED ORDER — FENTANYL CITRATE (PF) 100 MCG/2ML IJ SOLN
25.0000 ug | INTRAMUSCULAR | Status: DC | PRN
  Administered 2019-05-17: 25 ug via INTRAVENOUS

## 2019-05-17 MED ORDER — FENTANYL CITRATE (PF) 100 MCG/2ML IJ SOLN
INTRAMUSCULAR | Status: AC
  Administered 2019-05-17: 25 ug via INTRAVENOUS
  Filled 2019-05-17: qty 2

## 2019-05-17 MED ORDER — OXYBUTYNIN CHLORIDE 5 MG PO TABS
ORAL_TABLET | ORAL | Status: AC
  Administered 2019-05-17: 5 mg via ORAL
  Filled 2019-05-17: qty 1

## 2019-05-17 MED ORDER — OXYBUTYNIN CHLORIDE 5 MG PO TABS: 5.0000 mg | ORAL_TABLET | Freq: Three times a day (TID) | ORAL | 0 refills | Status: DC | PRN

## 2019-05-17 MED ORDER — FENTANYL CITRATE (PF) 100 MCG/2ML IJ SOLN
INTRAMUSCULAR | Status: DC | PRN
  Administered 2019-05-17: 50 ug via INTRAVENOUS

## 2019-05-17 MED ORDER — ROCURONIUM BROMIDE 50 MG/5ML IV SOLN
INTRAVENOUS | Status: AC
  Filled 2019-05-17: qty 1

## 2019-05-17 MED ORDER — OXYBUTYNIN CHLORIDE 5 MG PO TABS
5.0000 mg | ORAL_TABLET | Freq: Once | ORAL | Status: AC
  Filled 2019-05-17: qty 1

## 2019-05-17 MED ORDER — FAMOTIDINE 20 MG PO TABS
ORAL_TABLET | ORAL | Status: AC
  Filled 2019-05-17: qty 1

## 2019-05-17 MED ORDER — CEFAZOLIN SODIUM-DEXTROSE 2-4 GM/100ML-% IV SOLN
INTRAVENOUS | Status: AC
  Filled 2019-05-17: qty 100

## 2019-05-17 MED ORDER — EPHEDRINE SULFATE 50 MG/ML IJ SOLN
INTRAMUSCULAR | Status: AC
  Filled 2019-05-17: qty 1

## 2019-05-17 MED ORDER — PROPOFOL 10 MG/ML IV BOLUS
INTRAVENOUS | Status: AC
  Filled 2019-05-17: qty 20

## 2019-05-17 MED ORDER — LIDOCAINE HCL (CARDIAC) PF 100 MG/5ML IV SOSY
PREFILLED_SYRINGE | INTRAVENOUS | Status: DC | PRN
  Administered 2019-05-17: 100 mg via INTRAVENOUS

## 2019-05-17 MED ORDER — GLYCOPYRROLATE 0.2 MG/ML IJ SOLN
INTRAMUSCULAR | Status: DC | PRN
  Administered 2019-05-17: .2 mg via INTRAVENOUS

## 2019-05-17 MED ORDER — SUCCINYLCHOLINE CHLORIDE 20 MG/ML IJ SOLN
INTRAMUSCULAR | Status: DC | PRN
  Administered 2019-05-17: 120 mg via INTRAVENOUS

## 2019-05-17 MED ORDER — LACTATED RINGERS IV SOLN: INTRAVENOUS | Status: DC

## 2019-05-17 MED ORDER — FAMOTIDINE 20 MG PO TABS
20.0000 mg | ORAL_TABLET | Freq: Once | ORAL | Status: AC
  Administered 2019-05-17: 20 mg via ORAL

## 2019-05-17 MED ORDER — OXYCODONE HCL 5 MG PO TABS
ORAL_TABLET | ORAL | Status: AC
  Administered 2019-05-17: 5 mg
  Filled 2019-05-17: qty 1

## 2019-05-17 SURGICAL SUPPLY — 33 items
ADAPTER IRRIG TUBE 2 SPIKE SOL (ADAPTER) ×4 IMPLANT
ADPR TBG 2 SPK PMP STRL ASCP (ADAPTER) ×2
BAG DRN LRG CPC RND TRDRP CNTR (MISCELLANEOUS)
BAG DRN RND TRDRP ANRFLXCHMBR (UROLOGICAL SUPPLIES) ×1
BAG URINE DRAIN 2000ML AR STRL (UROLOGICAL SUPPLIES) ×1 IMPLANT
BAG URO DRAIN 4000ML (MISCELLANEOUS) IMPLANT
CATH FOL 2WAY LX 20X30 (CATHETERS) ×1 IMPLANT
CATH FOL 2WAY LX 22X30 (CATHETERS) IMPLANT
CATH FOLEY 3WAY 30CC 22FR (CATHETERS) IMPLANT
CATH URETL 5X70 OPEN END (CATHETERS) ×2 IMPLANT
CONTAINER COLLECT MORCELLATR (MISCELLANEOUS) ×1 IMPLANT
DRAPE 3/4 80X56 (DRAPES) ×2 IMPLANT
DRAPE UTILITY 15X26 TOWEL STRL (DRAPES) ×1 IMPLANT
FILTER OVERFLOW MORCELLATOR (FILTER) ×1 IMPLANT
GLOVE BIO SURGEON STRL SZ 6.5 (GLOVE) ×4 IMPLANT
GOWN STRL REUS W/ TWL LRG LVL3 (GOWN DISPOSABLE) ×2 IMPLANT
GOWN STRL REUS W/TWL LRG LVL3 (GOWN DISPOSABLE) ×4
HOLDER FOLEY CATH W/STRAP (MISCELLANEOUS) ×2 IMPLANT
KIT TURNOVER CYSTO (KITS) ×2 IMPLANT
LASER FIBER 550M SMARTSCOPE (Laser) ×2 IMPLANT
MORCELLATOR COLLECT CONTAINER (MISCELLANEOUS) ×2
MORCELLATOR OVERFLOW FILTER (FILTER) ×2
MORCELLATOR ROTATION 4.75 335 (MISCELLANEOUS) ×2 IMPLANT
PACK CYSTO AR (MISCELLANEOUS) ×2 IMPLANT
SET CYSTO W/LG BORE CLAMP LF (SET/KITS/TRAYS/PACK) IMPLANT
SET IRRIG Y TYPE TUR BLADDER L (SET/KITS/TRAYS/PACK) ×2 IMPLANT
SLEEVE PROTECTION STRL DISP (MISCELLANEOUS) ×4 IMPLANT
SOL .9 NS 3000ML IRR  AL (IV SOLUTION) ×4
SOL .9 NS 3000ML IRR AL (IV SOLUTION) ×4
SOL .9 NS 3000ML IRR UROMATIC (IV SOLUTION) ×4 IMPLANT
SYRINGE IRR TOOMEY STRL 70CC (SYRINGE) ×2 IMPLANT
TUBE PUMP MORCELLATOR PIRANHA (TUBING) ×3 IMPLANT
WATER STERILE IRR 1000ML POUR (IV SOLUTION) ×2 IMPLANT

## 2019-05-17 NOTE — Transfer of Care (Signed)
Immediate Anesthesia Transfer of Care Note  Patient: Timothy Kramer  Procedure(s) Performed: HOLEP-LASER ENUCLEATION OF THE PROSTATE WITH MORCELLATION (N/A Prostate)  Patient Location: PACU  Anesthesia Type:General  Level of Consciousness: sedated  Airway & Oxygen Therapy: Patient Spontanous Breathing and Patient connected to face mask oxygen  Post-op Assessment: Report given to RN and Post -op Vital signs reviewed and stable  Post vital signs: Reviewed and stable  Last Vitals:  Vitals Value Taken Time  BP    Temp    Pulse 59 05/17/19 1510  Resp 11 05/17/19 1510  SpO2 100 % 05/17/19 1510  Vitals shown include unvalidated device data.  Last Pain:  Vitals:   05/17/19 1051  TempSrc: Temporal  PainSc: 0-No pain         Complications: No apparent anesthesia complications

## 2019-05-17 NOTE — Discharge Instructions (Signed)
CALL OFFICE TOMORROW MORNING AND ASK ABOUT REMOVING FOLEY CATHETER AT HOME.    Indwelling Urinary Catheter Care, Adult An indwelling urinary catheter is a thin tube that is put into your bladder. The tube helps to drain pee (urine) out of your body. The tube goes in through your urethra. Your urethra is where pee comes out of your body. Your pee will come out through the catheter, then it will go into a bag (drainage bag). Take good care of your catheter so it will work well. How to wear your catheter and bag Supplies needed  Sticky tape (adhesive tape) or a leg strap.  Alcohol wipe or soap and water (if you use tape).  A clean towel (if you use tape).  Large overnight bag.  Smaller bag (leg bag). Wearing your catheter Attach your catheter to your leg with tape or a leg strap.  Make sure the catheter is not pulled tight.  If a leg strap gets wet, take it off and put on a dry strap.  If you use tape to hold the bag on your leg: 1. Use an alcohol wipe or soap and water to wash your skin where the tape made it sticky before. 2. Use a clean towel to pat-dry that skin. 3. Use new tape to make the bag stay on your leg. Wearing your bags You should have been given a large overnight bag.  You may wear the overnight bag in the day or night.  Always have the overnight bag lower than your bladder.  Do not let the bag touch the floor.  Before you go to sleep, put a clean plastic bag in a wastebasket. Then hang the overnight bag inside the wastebasket. You should also have a smaller leg bag that fits under your clothes.  Always wear the leg bag below your knee.  Do not wear your leg bag at night. How to care for your skin and catheter Supplies needed  A clean washcloth.  Water and mild soap.  A clean towel. Caring for your skin and catheter      Clean the skin around your catheter every day: 1. Wash your hands with soap and water. 2. Wet a clean washcloth in warm water and  mild soap. 3. Clean the skin around your urethra.  If you are male:  Gently spread the folds of skin around your vagina (labia).  With the washcloth in your other hand, wipe the inner side of your labia on each side. Wipe from front to back.  If you are male:  Pull back any skin that covers the end of your penis (foreskin).  With the washcloth in your other hand, wipe your penis in small circles. Start wiping at the tip of your penis, then move away from the catheter.  Move the foreskin back in place, if needed. 4. With your free hand, hold the catheter close to where it goes into your body.  Keep holding the catheter during cleaning so it does not get pulled out. 5. With the washcloth in your other hand, clean the catheter.  Only wipe downward on the catheter.  Do not wipe upward toward your body. Doing this may push germs into your urethra and cause infection. 6. Use a clean towel to pat-dry the catheter and the skin around it. Make sure to wipe off all soap. 7. Wash your hands with soap and water.  Shower every day. Do not take baths.  Do not use cream, ointment, or lotion  on the area where the catheter goes into your body, unless your doctor tells you to.  Do not use powders, sprays, or lotions on your genital area.  Check your skin around the catheter every day for signs of infection. Check for: ? Redness, swelling, or pain. ? Fluid or blood. ? Warmth. ? Pus or a bad smell. How to empty the bag Supplies needed  Rubbing alcohol.  Gauze pad or cotton ball.  Tape or a leg strap. Emptying the bag Pour the pee out of your bag when it is ?- full, or at least 2-3 times a day. Do this for your overnight bag and your leg bag. 1. Wash your hands with soap and water. 2. Separate (detach) the bag from your leg. 3. Hold the bag over the toilet or a clean pail. Keep the bag lower than your hips and bladder. This is so the pee (urine) does not go back into the  tube. 4. Open the pour spout. It is at the bottom of the bag. 5. Empty the pee into the toilet or pail. Do not let the pour spout touch any surface. 6. Put rubbing alcohol on a gauze pad or cotton ball. 7. Use the gauze pad or cotton ball to clean the pour spout. 8. Close the pour spout. 9. Attach the bag to your leg with tape or a leg strap. 10. Wash your hands with soap and water. Follow instructions for cleaning the drainage bag:  From the product maker.  As told by your doctor. How to change the bag Supplies needed  Alcohol wipes.  A clean bag.  Tape or a leg strap. Changing the bag Replace your bag when it starts to leak, smell bad, or look dirty. 1. Wash your hands with soap and water. 2. Separate the dirty bag from your leg. 3. Pinch the catheter with your fingers so that pee does not spill out. 4. Separate the catheter tube from the bag tube where these tubes connect (at the connection valve). Do not let the tubes touch any surface. 5. Clean the end of the catheter tube with an alcohol wipe. Use a different alcohol wipe to clean the end of the bag tube. 6. Connect the catheter tube to the tube of the clean bag. 7. Attach the clean bag to your leg with tape or a leg strap. Do not make the bag tight on your leg. 8. Wash your hands with soap and water. General rules   Never pull on your catheter. Never try to take it out. Doing that can hurt you.  Always wash your hands before and after you touch your catheter or bag. Use a mild, fragrance-free soap. If you do not have soap and water, use hand sanitizer.  Always make sure there are no twists or bends (kinks) in the catheter tube.  Always make sure there are no leaks in the catheter or bag.  Drink enough fluid to keep your pee pale yellow.  Do not take baths, swim, or use a hot tub.  If you are male, wipe from front to back after you poop (have a bowel movement). Contact a doctor if:  Your pee is cloudy.  Your  pee smells worse than usual.  Your catheter gets clogged.  Your catheter leaks.  Your bladder feels full. Get help right away if:  You have redness, swelling, or pain where the catheter goes into your body.  You have fluid, blood, pus, or a bad smell coming from  the area where the catheter goes into your body.  Your skin feels warm where the catheter goes into your body.  You have a fever.  You have pain in your: ? Belly (abdomen). ? Legs. ? Lower back. ? Bladder.  You see blood in the catheter.  Your pee is pink or red.  You feel sick to your stomach (nauseous).  You throw up (vomit).  You have chills.  Your pee is not draining into the bag.  Your catheter gets pulled out. Summary  An indwelling urinary catheter is a thin tube that is placed into the bladder to help drain pee (urine) out of the body.  The catheter is placed into the part of the body that drains pee from the bladder (urethra).  Taking good care of your catheter will keep it working properly and help prevent problems.  Always wash your hands before and after touching your catheter or bag.  Never pull on your catheter or try to take it out. This information is not intended to replace advice given to you by your health care provider. Make sure you discuss any questions you have with your health care provider. Document Revised: 07/28/2018 Document Reviewed: 11/19/2016 Elsevier Patient Education  2020 [employer]   AMBULATORY SURGERY  DISCHARGE INSTRUCTIONS   1) The drugs that you were given will stay in your system until tomorrow so for the next 24 hours you should not:  A) Drive an automobile B) Make any legal decisions C) Drink any alcoholic beverage   2) You may resume regular meals tomorrow.  Today it is better to start with liquids and gradually work up to solid foods.  You may eat anything you prefer, but it is better to start with liquids, then soup and crackers, and gradually  work up to solid foods.   3) Please notify your doctor immediately if you have any unusual bleeding, trouble breathing, redness and pain at the surgery site, drainage, fever, or pain not relieved by medication.    4) Additional Instructions:   Please contact your physician with any problems or Same Day Surgery at [phone], Monday through Friday 6 am to 4 pm, or [place] at [facility] number at [phone].

## 2019-05-17 NOTE — Op Note (Signed)
Date of procedure: 05/17/19  Preoperative diagnosis:  1. BPH with BOO  Postoperative diagnosis:  1. same   Procedure: 1. HoLEP with morcellation  Surgeon: [name], MD  Anesthesia: General  Complications: None  Intraoperative findings: Large median lobe, trilobar coaptation  EBL: 100 cc  Specimens: prostate chips  Drains: 20 Fr 2 way foley  Indication: Timothy Kramer is a 64 y.o. patient with refractory massive BPH symptoms refractory symptoms.  After reviewing the management options for treatment, he elected to proceed with the above surgical procedure(s). We have discussed the potential benefits and risks of the procedure, side effects of the proposed treatment, the likelihood of the patient achieving the goals of the procedure, and any potential problems that might occur during the procedure or recuperation. Informed consent has been obtained.  Description of procedure:  The patient was taken to the operating room and general anesthesia was induced.  The patient was placed in the dorsal lithotomy position, prepped and draped in the usual sterile fashion, and preoperative antibiotics were administered. A preoperative time-out was performed.     A 26 French resectoscope sheath using a blunt angled obturator was introduced without difficulty into the bladder.  The bladder was carefully inspected and noted to be moderately trabeculated.  There is an elevated bladder neck with a very small intravesical component.  The trigone was able to be visualized with some manipulation and the UOs were good distance bladder neck itself.  The prostatic fossa had significant trilobar coaptation with greater than 5 cm prostatic length.  A 550 m laser fiber was then brought in and using settings of 0.9 J's and 53 Hz, 2 incisions were created at the 5:00 and 7:00 positions of the bladder neck on either side of the median lobe down to the level of the bladder neck/capsular fibers.  The incision was  carried down caudally meeting in the midline just above the verumontanum.  The median lobe was then enucleated from a caudal to cranial direction cleaving the adenoma off the underlying capsule rolling it towards the bladder neck and ultimately cleaving the mucosa to free the median lobe into the bladder.   Next, a semilunar incision was created at the prostatic apex on the left side again freeing up the adenoma from the underlying capsule.  Care was taken to avoid any resection past the verumontanum.  This incision was carried around laterally and cranially towards the bladder neck.  Ultimately, I was able to complete the anterior commissure mucosa and the adenoma into the bladder creating a widely patent prostatic fossa.     Next, the same similar incision was created at the right prostatic apex.  This adenoma however ended up being enucleated and more of a piece wise fashion freeing up a large BPH nodules from the capsular fibers.  Once this was completed and cleared from the bladder neck, the prostatic fossa was noted to be widely patent.  Hemostasis was achieved using hemostatic fiber settings.  Bilateral UOs were visualized and free of any injury.  Finally, the 44 French resectoscope was exchanged for nephroscope and using the Piranha handpiece morcellator, the bladder was distended in each of the prostate chips were evacuated.  The bladder was irrigated several times and smaller nodules were clear for the bladder.  This point time, there were no residual fibers appreciated in the bladder.  There was a mucosal injury on the left lateral bladder wall and posterior wall with exposure of some bladder muscle fibers but no  fat or concern for bladder perforation. A bipolar loop was brought in to provide hemostasis and fulgerate the base.  The bladder neck was also fulgerated. Hemostasis was adequate.  10 mg of IV Lasix was administered to help with postoperative diuresis.  The UOs were reinspected and clear  urine effluxed from both.  A 20 French two-way Foley catheter was then inserted over a catheter guide with 30 cc in the balloon.  The catheter irrigated easily and well.  Patient was then clean and dry, repositioned supine position, reversed from anesthesia, taken to PACU in stable condition.   Plan: Patient will return to the office in Monday for voiding trial.      Hollice Espy, M.D.

## 2019-05-17 NOTE — H&P (Signed)
05/17/19  H&P updated today No changes in medical history RRR CTAB Preop UCx with 5k enterococcus, treated with Levaquin x 5 days  Millie Shorb  1955-12-29  [id number]   Referring provider: Renford Dills, MD  [address]  [address]  [place], [place] [address]       Chief Complaint  Patient presents with  . Benign Prostatic Hypertrophy   HPI:  64 year old male with personal history of BPH/prostamegaly referred by Berniece Salines for consideration of holmium laser enucleation of the prostate.  The patient has had poorly controlled and worsening urinary symptoms over the past several years. He has been on Flomax for many years. Recently, he was tried on Myrbetriq which he was unable to tolerate. He is now interested in surgical intervention.  As part of his evaluation, he underwent prostate sizing which revealed 175 g prostate. Please see records from [facility] urology for details.  Most recent PSA 2.93 on 02/2018. This was repeats last week by his PCP and remains stable.  Records from [facility] urology reviewed extensively today.           IPSS            Row Name 04/04/19 1000             International Prostate Symptom Score    How often have you had the sensation of not emptying your bladder? About half the time      How often have you had to urinate less than every two hours? About half the time      How often have you found you stopped and started again several times when you urinated? About half the time      How often have you found it difficult to postpone urination? More than half the time      How often have you had a weak urinary stream? More than half the time      How often have you had to strain to start urination? About half the time      How many times did you typically get up at night to urinate? 2 Times      Total IPSS Score 22             Quality of Life due to urinary symptoms    If you were to spend the rest of your life with your urinary  condition just the way it is now how would you feel about that? Unhappy         PMH:  No past medical history on file.  Surgical History:       Past Surgical History:  Procedure Laterality Date  . INNER EAR SURGERY     Home Medications:  Allergies as of 04/04/2019   No Known Allergies          Medication List       Accurate as of April 04, 2019 12:22 PM. If you have any questions, ask your nurse or doctor.        atenolol 50 MG tablet  Commonly known as: TENORMIN  Take 50 mg by mouth daily.   tamsulosin 0.4 MG Caps capsule  Commonly known as: FLOMAX  Take 0.8 mg by mouth daily.       Allergies: No Known Allergies  Family History:  No family history on file.  Social History: reports that he has never smoked. He has never used smokeless tobacco. He reports current alcohol use. No history on file for  drug.  ROS:  UROLOGY  Frequent Urination?: Yes  Hard to postpone urination?: Yes  Burning/pain with urination?: No  Get up at night to urinate?: Yes  Leakage of urine?: No  Urine stream starts and stops?: Yes  Trouble starting stream?: No  Do you have to strain to urinate?: No  Blood in urine?: No  Urinary tract infection?: No  Sexually transmitted disease?: No  Injury to kidneys or bladder?: No  Painful intercourse?: No  Weak stream?: Yes  Erection problems?: No  Penile pain?: No  Gastrointestinal  Nausea?: No  Vomiting?: No  Indigestion/heartburn?: No  Diarrhea?: No  Constipation?: No  Constitutional  Fever: No  Night sweats?: No  Weight loss?: No  Fatigue?: No  Skin  Skin rash/lesions?: No  Itching?: No  Eyes  Blurred vision?: No  Double vision?: No  Ears/Nose/Throat  Sore throat?: No  Sinus problems?: No  Hematologic/Lymphatic  Swollen glands?: No  Easy bruising?: No  Cardiovascular  Leg swelling?: No  Chest pain?: No  Respiratory  Cough?: No  Shortness of breath?: No  Endocrine  Excessive thirst?: No  Musculoskeletal  Back  pain?: No  Joint pain?: No  Neurological  Headaches?: No  Dizziness?: No  Psychologic  Depression?: No  Anxiety?: No  Physical Exam:  BP (!) 156/81  Pulse 60  Ht 6\' 4"  (1.93 m)  Wt 220 lb (99.8 kg)  BMI 26.78 kg/m  Constitutional: Alert and oriented, No acute distress.  HEENT: [place] AT, moist mucus membranes. Trachea midline, no masses.  Cardiovascular: No clubbing, cyanosis, or edema.  Respiratory: Normal respiratory effort, no increased work of breathing..  Skin: No rashes, bruises or suspicious lesions.  Neurologic: Grossly intact, no focal deficits, moving all 4 extremities.  Psychiatric: Normal mood and affect.    Laboratory Data:  Labs as above       Results for orders placed or performed in visit on 04/04/19  Microscopic Examination   URINE  Result Value Ref Range   WBC, UA 0-5 0 - 5 /hpf   RBC None seen 0 - 2 /hpf   Epithelial Cells (non renal) None seen 0 - 10 /hpf   Bacteria, UA None seen None seen/Few  Urinalysis, Complete  Result Value Ref Range   Specific Gravity, UA 1.020 1.005 - 1.030   pH, UA 6.0 5.0 - 7.5   Color, UA Yellow Yellow   Appearance Ur Clear Clear   Leukocytes,UA Negative Negative   Protein,UA Negative Negative/Trace   Glucose, UA Negative Negative   Ketones, UA Negative Negative   RBC, UA Negative Negative   Bilirubin, UA Negative Negative   Urobilinogen, Ur 0.2 0.2 - 1.0 mg/dL   Nitrite, UA Negative Negative   Microscopic Examination See below:   Bladder Scan (Post Void Residual) in office  Result Value Ref Range   Scan Result 16    Assessment & Plan:   1. BPH with urinary obstruction  Significant prostamegaly, 175 g with both obstructive and irritative voiding symptoms which are poorly controlled on Flomax alone   Patient is considering surgical intervention and primarily presents today to discuss holmium laser enucleation of the prostate. Alternatives including simple prostatectomy versus robotic simple prostatectomy reviewed  along with the differences between each of the procedures.   We discussed alternatives including TURP vs. holmium laser enucleation of the prostate vs. greenlight laser ablation. Differences between the surgical procedures were discussed as well as the risks and benefits of each. He is most interested in HoLEP.  We discussed the common postoperative course following holep including need for overnight Foley catheter, temporary worsening of irritative voiding symptoms, and occasional stress incontinence which typically lasts up to 6 months but can persist. We discussed retrograde ejaculation and damage to surrounding structures including the urinary sphincter. Other uncommon complications including hematuria and urinary tract infection.   He understands all of the above and is willing to proceed as planned.   - Urinalysis, Complete  - Bladder Scan (Post Void Residual) in office  - Urine Culture, Comprehensive    2. Urinary frequency  We discussed that although his irritative voiding symptoms may be related to outlet obstruction, he may also have Denovo bladder overactivity which can be managed medically. He understands that the above procedure may or may not help this issue. All questions answered.     Hollice Espy, MD  [facility]  [address] [place]  [place], [place] [address]  [phone]

## 2019-05-17 NOTE — Anesthesia Procedure Notes (Signed)
Procedure Name: Intubation Date/Time: 05/17/2019 12:21 PM Performed by: Junious Silk, CRNA Pre-anesthesia Checklist: Patient identified, Patient being monitored, Timeout performed, Emergency Drugs available and Suction available Patient Re-evaluated:Patient Re-evaluated prior to induction Oxygen Delivery Method: Circle system utilized Preoxygenation: Pre-oxygenation with 100% oxygen Induction Type: IV induction Ventilation: Oral airway inserted - appropriate to patient size and Mask ventilation without difficulty Laryngoscope Size: McGraph and 4 Grade View: Grade I Tube type: Oral Tube size: 7.5 mm Number of attempts: 1 Airway Equipment and Method: Stylet Placement Confirmation: ETT inserted through vocal cords under direct vision,  positive ETCO2 and breath sounds checked- equal and bilateral Secured at: 21 cm Tube secured with: Tape Dental Injury: Teeth and Oropharynx as per pre-operative assessment  Comments: DVL and Intubation performed by Terrence Dupont under supervision of Junious Silk CRNA

## 2019-05-17 NOTE — Anesthesia Preprocedure Evaluation (Signed)
Anesthesia Evaluation  Patient identified by MRN, date of birth, ID band Patient awake    Reviewed: Allergy & Precautions, H&P , NPO status , Patient's Chart, lab work & pertinent test results  History of Anesthesia Complications Negative for: history of anesthetic complications  Airway Mallampati: III  TM Distance: >3 FB Neck ROM: full    Dental  (+) Chipped   Pulmonary neg pulmonary ROS, neg shortness of breath, neg COPD,           Cardiovascular hypertension, (-) angina(-) Past MI and (-) Cardiac Stents (-) dysrhythmias      Neuro/Psych negative neurological ROS  negative psych ROS   GI/Hepatic negative GI ROS, Neg liver ROS,   Endo/Other  negative endocrine ROS  Renal/GU      Musculoskeletal   Abdominal   Peds  Hematology negative hematology ROS (+)   Anesthesia Other Findings Past Medical History: No date: Hypertension  Past Surgical History: No date: INNER EAR SURGERY No date: KNEE CARTILAGE SURGERY; Right     Reproductive/Obstetrics negative OB ROS                             Anesthesia Physical Anesthesia Plan  ASA: II  Anesthesia Plan: General ETT   Post-op Pain Management:    Induction:   PONV Risk Score and Plan: Ondansetron, Dexamethasone, Midazolam and Treatment may vary due to age or medical condition  Airway Management Planned:   Additional Equipment:   Intra-op Plan:   Post-operative Plan:   Informed Consent: I have reviewed the patients History and Physical, chart, labs and discussed the procedure including the risks, benefits and alternatives for the proposed anesthesia with the patient or authorized representative who has indicated his/her understanding and acceptance.     Dental Advisory Given  Plan Discussed with: Anesthesiologist  Anesthesia Plan Comments:         Anesthesia Quick Evaluation

## 2019-05-18 ENCOUNTER — Other Ambulatory Visit: Payer: BC Managed Care – PPO

## 2019-05-21 ENCOUNTER — Ambulatory Visit (INDEPENDENT_AMBULATORY_CARE_PROVIDER_SITE_OTHER): Payer: BC Managed Care – PPO | Admitting: Physician Assistant

## 2019-05-21 ENCOUNTER — Encounter: Payer: Self-pay | Admitting: Physician Assistant

## 2019-05-21 ENCOUNTER — Telehealth: Payer: Self-pay | Admitting: *Deleted

## 2019-05-21 ENCOUNTER — Ambulatory Visit: Payer: BC Managed Care – PPO | Admitting: Physician Assistant

## 2019-05-21 ENCOUNTER — Other Ambulatory Visit: Payer: BC Managed Care – PPO

## 2019-05-21 ENCOUNTER — Other Ambulatory Visit: Payer: Self-pay

## 2019-05-21 VITALS — BP 130/85 | HR 70 | Ht 72.0 in | Wt 220.0 lb

## 2019-05-21 DIAGNOSIS — N401 Enlarged prostate with lower urinary tract symptoms: Secondary | ICD-10-CM

## 2019-05-21 DIAGNOSIS — N138 Other obstructive and reflux uropathy: Secondary | ICD-10-CM

## 2019-05-21 LAB — SURGICAL PATHOLOGY

## 2019-05-21 LAB — BLADDER SCAN AMB NON-IMAGING: Scan Result: 21

## 2019-05-21 NOTE — Telephone Encounter (Addendum)
Left patient a VM with detail, asked to return call with any questions.   ----- Message from [name], MD sent at 05/21/2019 11:48 AM EST ----- Pathology is benign, great news  [name], MD

## 2019-05-21 NOTE — Anesthesia Postprocedure Evaluation (Signed)
Anesthesia Post Note  Patient: Timothy Kramer  Procedure(s) Performed: HOLEP-LASER ENUCLEATION OF THE PROSTATE WITH MORCELLATION (N/A Prostate)  Patient location during evaluation: PACU Anesthesia Type: General Level of consciousness: awake and alert Pain management: pain level controlled Vital Signs Assessment: post-procedure vital signs reviewed and stable Respiratory status: spontaneous breathing, nonlabored ventilation and respiratory function stable Cardiovascular status: blood pressure returned to baseline and stable Postop Assessment: no apparent nausea or vomiting Anesthetic complications: no     Last Vitals:  Vitals:   05/17/19 1615 05/17/19 1643  BP: (!) 142/71 (!) 157/77  Pulse: (!) 42 (!) 51  Resp: 10 14  Temp:  (!) 36.4 C  SpO2: 99% 100%    Last Pain:  Vitals:   05/18/19 0814  TempSrc:   PainSc: 0-No pain                 Karleen Hampshire

## 2019-05-21 NOTE — Progress Notes (Signed)
Fill and Pull Catheter Removal  Patient is present today for a catheter removal.  Patient was cleaned and prepped in a sterile fashion, 24ml of sterile saline was instilled into the bladder and I elected not to instill more due to painful bladder spasm and patient discomfort. 81ml of water was then drained from the balloon.  A 20FR foley cath was removed from the bladder without difficulty. Patient tolerated well.  Performed by: Carman Ching, PA-C   Follow up/ Additional notes: Push fluids and RTC this afternoon for PVR.

## 2019-05-21 NOTE — Progress Notes (Signed)
Patient returned to clinic for afternoon follow-up.  He reports drinking approximately half a gallon of water and has been able to urinate on his own.  PVR 21 mL; voiding trial passed.  He does note some slight, pink gross hematuria as well as urinary leakage.  I explained that these are normal findings after surgery and should resolve on their own.  I advised him to wear absorbent underwear for security.  He expressed understanding.  Results for orders placed or performed in visit on 05/21/19  BLADDER SCAN AMB NON-IMAGING  Result Value Ref Range   Scan Result 21     Return in about 6 weeks (around 07/02/2019) for Postop follow-up with Dr. Apolinar Junes.

## 2019-05-24 ENCOUNTER — Other Ambulatory Visit: Payer: BC Managed Care – PPO

## 2019-05-24 ENCOUNTER — Telehealth: Payer: Self-pay

## 2019-05-24 NOTE — Telephone Encounter (Signed)
Patient called stating that he is experiencing nocturia with a good stream post op but during the day his stream is weak and he has leakage. He was reassured that with only being a week post op he is still healing and it may take 4-6wks for his urinary symptoms to start normalizing. Patient verbalized understanding

## 2019-05-25 ENCOUNTER — Other Ambulatory Visit: Payer: BC Managed Care – PPO

## 2019-07-02 NOTE — Progress Notes (Signed)
07/03/19 12:54 PM   Timothy Kramer 25-Jul-1955 [id number]  Referring provider: Renford Dills, MD [address] [place],  [place] [address]  Chief Complaint  Patient presents with  . Routine Post Op    HPI: Timothy Kramer is a 64 y.o. African American M who returns today for a 6 week f/u s/p Holep-laser enucleation of the prostate.   Surgical pathology reviewed, 52 g resection, benign prostatic tissue. Estimated prostate volume preoperatively 175 g.   He reports of one episode of blood in his urine onset last week after running.  This was self-limited and resolved.  Has not had a recurrence.  His flow has tremendously improved and pt is satisfied. He reports of little incontinence which worsens when exercising however no incontinence the last couple of days.   He is currently not on any BPH medications.   His PVR is 5 mL.   IPSS    Row Name 07/03/19 1100         International Prostate Symptom Score   How often have you had the sensation of not emptying your bladder?  Not at All     How often have you had to urinate less than every two hours?  Not at All     How often have you found you stopped and started again several times when you urinated?  Less than 1 in 5 times     How often have you found it difficult to postpone urination?  Less than half the time     How often have you had a weak urinary stream?  Less than 1 in 5 times     How often have you had to strain to start urination?  Less than half the time     How many times did you typically get up at night to urinate?  2 Times     Total IPSS Score  8       Quality of Life due to urinary symptoms   If you were to spend the rest of your life with your urinary condition just the way it is now how would you feel about that?  Mixed        Score:  1-7 Mild 8-19 Moderate 20-35 Severe  PMH: Past Medical History:  Diagnosis Date  . Hypertension     Surgical History: Past Surgical History:    Procedure Laterality Date  . HOLEP-LASER ENUCLEATION OF THE PROSTATE WITH MORCELLATION N/A 05/17/2019   Procedure: HOLEP-LASER ENUCLEATION OF THE PROSTATE WITH MORCELLATION;  Surgeon: Timothy Scotland, MD;  Location: [facility] ORS;  Service: Urology;  Laterality: N/A;  . INNER EAR SURGERY    . KNEE CARTILAGE SURGERY Right     Home Medications:  Allergies as of 07/03/2019   No Known Allergies     Medication List       Accurate as of July 03, 2019 12:54 PM. If you have any questions, ask your nurse or doctor.        STOP taking these medications   docusate sodium 100 MG capsule Commonly known as: COLACE Stopped by: Timothy Scotland, MD   levofloxacin 500 MG tablet Commonly known as: LEVAQUIN Stopped by: Timothy Scotland, MD   oxybutynin 5 MG tablet Commonly known as: DITROPAN Stopped by: Timothy Scotland, MD   oxyCODONE-acetaminophen 5-325 MG tablet Commonly known as: Percocet Stopped by: Timothy Scotland, MD   tamsulosin 0.4 MG Caps capsule Commonly known as: FLOMAX Stopped by: Timothy Scotland, MD  TAKE these medications   amLODipine 10 MG tablet Commonly known as: NORVASC Take 10 mg by mouth daily.   atenolol 50 MG tablet Commonly known as: TENORMIN Take 50 mg by mouth daily.   VITAMIN D3 PO Take 1 tablet by mouth daily.       Allergies: No Known Allergies  Family History: No family history on file.  Social History:  reports that he has never smoked. He has never used smokeless tobacco. He reports current alcohol use. He reports that he does not use drugs.   Physical Exam: BP (!) 144/83   Pulse (!) 53   Ht 6' (1.829 m)   Wt 229 lb (103.9 kg)   BMI 31.06 kg/m   Constitutional:  Alert and oriented, No acute distress. HEENT: [place] AT, moist mucus membranes.  Trachea midline, no masses. Cardiovascular: No clubbing, cyanosis, or edema. Respiratory: Normal respiratory effort, no increased work of breathing. Skin: No rashes, bruises or suspicious  lesions. Neurologic: Grossly intact, no focal deficits, moving all 4 extremities. Psychiatric: Normal mood and affect.  Laboratory Data: SURGICAL PATHOLOGY  CASE: [id number]  PATIENT: Timothy Kramer  Surgical Pathology Report      Specimen Submitted:  A. Prostate chips   Clinical History: BPH with urinary obstruction       DIAGNOSIS:  A. PROSTATE; TRANSURETHRAL RESECTION:  - BENIGN PROSTATIC TISSUE WITH NODULAR HYPERPLASIA.  - NEGATIVE FOR MALIGNANCY.    GROSS DESCRIPTION:  A. Labeled: Prostate chips  Received: In formalin  Type of procedure: TURP  Weight: 52 grams  Size: 8 x 8 x 2.3 cm  Description: White rubbery soft tissue fragments  Percentage of tissue submitted for microscopic review: 75%   Block summary:  1-15-multiple representative tissue fragments    Final Diagnosis performed by Timothy Pries, MD.  Electronically signed  05/21/2019 11:11:57AM  The electronic signature indicates that the named Attending Pathologist  has evaluated the specimen  Technical component performed at [facility], [address] [place],  [place] [address] Lab: [phone] Dir: Rush Farmer, MD, MMM  Professional component performed at [facility], [facility], [place], [place], [place] [address] Lab: [phone]  Dir: Dellia Nims. Reuel Derby, MD   I have reviewed the surgical pathology report.   Pertinent Imaging: Results for orders placed or performed in visit on 07/03/19  BLADDER SCAN AMB NON-IMAGING  Result Value Ref Range   Scan Result 5    Assessment & Plan:    1. Benign prostatic hyperplasia with urinary obstruction Significant improvement obstructive urinary symptoms Adequate emptying of bladder  No longer taking Flomax, no BPH meds F/u in 6 months with IPSS/PSA for new baseline  2. Elevated PSA Previous history of elevated PSA, likely related to gland size PSA at next visit for new baseline Pathology benign   3. Stress incontinence of  urine Patient reassured that this is normal and is improving I recommended pelvic floor exercises.  Offered to give him a handout via MyChart but he does not have access to this currently. We reviewed how to perform these, at least 3 sets of 10 daily.  Offered physical therapy referral, declined at length let us know if he like to pursue this in the future.  Return in about 6 months (around 01/03/2020) for IPSS/ PVR/ PSA.  [facility] [address] [place], [place] [address] [phone]  I, Lucas Mallow, am acting as a scribe for Dr. Hollice Espy,  I have reviewed the above documentation for accuracy  and completeness, and I agree with the above.   Hollice Espy, MD

## 2019-07-03 ENCOUNTER — Ambulatory Visit (INDEPENDENT_AMBULATORY_CARE_PROVIDER_SITE_OTHER): Payer: BC Managed Care – PPO | Admitting: Urology

## 2019-07-03 ENCOUNTER — Other Ambulatory Visit: Payer: Self-pay

## 2019-07-03 ENCOUNTER — Encounter: Payer: Self-pay | Admitting: Urology

## 2019-07-03 VITALS — BP 144/83 | HR 53 | Ht 72.0 in | Wt 229.0 lb

## 2019-07-03 DIAGNOSIS — R350 Frequency of micturition: Secondary | ICD-10-CM

## 2019-07-03 LAB — BLADDER SCAN AMB NON-IMAGING: Scan Result: 5

## 2019-09-03 DIAGNOSIS — Z1211 Encounter for screening for malignant neoplasm of colon: Secondary | ICD-10-CM | POA: Diagnosis not present

## 2019-09-21 DIAGNOSIS — Z1159 Encounter for screening for other viral diseases: Secondary | ICD-10-CM | POA: Diagnosis not present

## 2019-09-26 DIAGNOSIS — D122 Benign neoplasm of ascending colon: Secondary | ICD-10-CM | POA: Diagnosis not present

## 2019-09-26 DIAGNOSIS — K648 Other hemorrhoids: Secondary | ICD-10-CM | POA: Diagnosis not present

## 2019-09-26 DIAGNOSIS — Z1211 Encounter for screening for malignant neoplasm of colon: Secondary | ICD-10-CM | POA: Diagnosis not present

## 2019-09-26 DIAGNOSIS — K635 Polyp of colon: Secondary | ICD-10-CM | POA: Diagnosis not present

## 2020-01-03 ENCOUNTER — Other Ambulatory Visit: Payer: Self-pay | Admitting: Family Medicine

## 2020-01-03 DIAGNOSIS — N138 Other obstructive and reflux uropathy: Secondary | ICD-10-CM

## 2020-01-03 DIAGNOSIS — N401 Enlarged prostate with lower urinary tract symptoms: Secondary | ICD-10-CM

## 2020-01-04 ENCOUNTER — Other Ambulatory Visit: Payer: Self-pay

## 2020-01-04 ENCOUNTER — Other Ambulatory Visit: Payer: BC Managed Care – PPO

## 2020-01-04 DIAGNOSIS — N401 Enlarged prostate with lower urinary tract symptoms: Secondary | ICD-10-CM | POA: Diagnosis not present

## 2020-01-04 DIAGNOSIS — N138 Other obstructive and reflux uropathy: Secondary | ICD-10-CM

## 2020-01-05 LAB — PSA: Prostate Specific Ag, Serum: 0.9 ng/mL (ref 0.0–4.0)

## 2020-01-07 NOTE — Progress Notes (Signed)
01/08/2020 3:20 PM   Timothy Kramer 03/18/1956 [id number]  Referring provider: Renford Dills, MD [address] [place],  [place] [address] Chief Complaint  Patient presents with  . Urinary Frequency    37mo w/PVR     HPI: Timothy Kramer is a 64 y.o. male who presents for a 6 month follow up of BPH with urinary obstruction, elevated PSA and stress incontinence.   Patient is s/p HoLEP-laser enucleation of the prostate 05/17/2019.   Surgical pathology reviewed, 52 g resection, benign prostatic tissue. Estimated prostate volume preoperatively 175 g.   Most recent PSA 0.9 ng/dL as of 35/59/7416.   PVR 18 mL. Reports some urgency and frequency with caffeine consumption.  Otherwise no issues.   He notes urinary symptoms are minimal. He is happy with his current symptoms.    IPSS    Row Name 01/08/20 1500         International Prostate Symptom Score   How often have you had the sensation of not emptying your bladder? Less than 1 in 5     How often have you had to urinate less than every two hours? Less than half the time     How often have you found you stopped and started again several times when you urinated? Less than 1 in 5 times     How often have you found it difficult to postpone urination? Less than half the time     How often have you had a weak urinary stream? Less than 1 in 5 times     How often have you had to strain to start urination? Not at All     How many times did you typically get up at night to urinate? 1 Time     Total IPSS Score 8       Quality of Life due to urinary symptoms   If you were to spend the rest of your life with your urinary condition just the way it is now how would you feel about that? Pleased            Score:  1-7 Mild 8-19 Moderate 20-35 Severe   PMH: Past Medical History:  Diagnosis Date  . Hypertension     Surgical History: Past Surgical History:  Procedure Laterality Date  . HOLEP-LASER ENUCLEATION OF THE  PROSTATE WITH MORCELLATION N/A 05/17/2019   Procedure: HOLEP-LASER ENUCLEATION OF THE PROSTATE WITH MORCELLATION;  Surgeon: [name], MD;  Location: [facility] ORS;  Service: Urology;  Laterality: N/A;  . INNER EAR SURGERY    . KNEE CARTILAGE SURGERY Right     Home Medications:  Allergies as of 01/08/2020   No Known Allergies     Medication List       Accurate as of January 08, 2020  3:20 PM. If you have any questions, ask your nurse or doctor.        amLODipine 10 MG tablet Commonly known as: NORVASC Take 10 mg by mouth daily.   atenolol 50 MG tablet Commonly known as: TENORMIN Take 50 mg by mouth daily.   VITAMIN D3 PO Take 1 tablet by mouth daily.       Allergies: No Known Allergies  Family History: History reviewed. No pertinent family history.  Social History:  reports that he has never smoked. He has never used smokeless tobacco. He reports current alcohol use. He reports that he does not use drugs.   Physical Exam: BP (!) 179/76  Pulse (!) 55   Ht 6' (1.829 m)   Wt 221 lb (100.2 kg)   BMI 29.97 kg/m   Constitutional:  Alert and oriented, No acute distress. HEENT: [place] AT, moist mucus membranes.  Trachea midline, no masses. Cardiovascular: No clubbing, cyanosis, or edema. Respiratory: Normal respiratory effort, no increased work of breathing. Skin: No rashes, bruises or suspicious lesions. Neurologic: Grossly intact, no focal deficits, moving all 4 extremities. Psychiatric: Normal mood and affect.   Pertinent Imaging: Results for orders placed or performed in visit on 01/08/20  BLADDER SCAN AMB NON-IMAGING  Result Value Ref Range   Scan Result 40ml     Assessment & Plan:    1. BPH with urinary obstruction Urinary symptoms are minimally bothersome. Adequate emptying.  PVR 18 mL. IPSS score: 8, moderate. No BPH meds  2. Elevated PSA Most recent PSA 0.9 ng/dL as of 16/55/3748, this will serve as a new baseline.  Will defer prostate cancer  screening to PCP.   Follow up as needed.    [facility] [address] [place], [place] [address] [phone]  I, [name], am acting as a scribe for Dr. [name].  I have reviewed the above documentation for accuracy and completeness, and I agree with the above.   [name], MD

## 2020-01-08 ENCOUNTER — Other Ambulatory Visit: Payer: Self-pay

## 2020-01-08 ENCOUNTER — Encounter: Payer: Self-pay | Admitting: Urology

## 2020-01-08 ENCOUNTER — Ambulatory Visit (INDEPENDENT_AMBULATORY_CARE_PROVIDER_SITE_OTHER): Payer: BC Managed Care – PPO | Admitting: Urology

## 2020-01-08 VITALS — BP 179/76 | HR 55 | Ht 72.0 in | Wt 221.0 lb

## 2020-01-08 DIAGNOSIS — N401 Enlarged prostate with lower urinary tract symptoms: Secondary | ICD-10-CM | POA: Diagnosis not present

## 2020-01-08 DIAGNOSIS — D2372 Other benign neoplasm of skin of left lower limb, including hip: Secondary | ICD-10-CM | POA: Diagnosis not present

## 2020-01-08 DIAGNOSIS — I10 Essential (primary) hypertension: Secondary | ICD-10-CM | POA: Insufficient documentation

## 2020-01-08 DIAGNOSIS — N138 Other obstructive and reflux uropathy: Secondary | ICD-10-CM | POA: Diagnosis not present

## 2020-01-08 DIAGNOSIS — L905 Scar conditions and fibrosis of skin: Secondary | ICD-10-CM | POA: Diagnosis not present

## 2020-01-08 LAB — BLADDER SCAN AMB NON-IMAGING

## 2020-01-11 ENCOUNTER — Other Ambulatory Visit: Payer: Self-pay

## 2020-01-11 DIAGNOSIS — I83899 Varicose veins of unspecified lower extremities with other complications: Secondary | ICD-10-CM

## 2020-01-15 ENCOUNTER — Other Ambulatory Visit: Payer: Self-pay

## 2020-01-15 ENCOUNTER — Ambulatory Visit: Payer: BC Managed Care – PPO

## 2020-01-15 ENCOUNTER — Ambulatory Visit: Payer: Self-pay | Admitting: Urology

## 2020-01-15 ENCOUNTER — Ambulatory Visit
Admission: RE | Admit: 2020-01-15 | Discharge: 2020-01-15 | Disposition: A | Discharge status: Home / Self Care | Payer: BC Managed Care – PPO | Source: Ambulatory Visit | Attending: Internal Medicine | Admitting: Internal Medicine

## 2020-01-15 DIAGNOSIS — I83899 Varicose veins of unspecified lower extremities with other complications: Secondary | ICD-10-CM | POA: Diagnosis not present

## 2020-01-15 DIAGNOSIS — Z23 Encounter for immunization: Secondary | ICD-10-CM | POA: Insufficient documentation

## 2020-01-15 NOTE — Progress Notes (Signed)
   Covid-19 Vaccination Clinic  Name:  Timothy Kramer    MRN: [id number] DOB: 1956-03-12  01/15/2020  Timothy Kramer was observed post Covid-19 immunization for 15 minutes without incident. He was provided with Vaccine Information Sheet and instruction to access the V-Safe system.   Timothy Kramer was instructed to call 911 with any severe reactions post vaccine: Marland Kitchen Difficulty breathing  . Swelling of face and throat  . A fast heartbeat  . A bad rash all over body  . Dizziness and weakness

## 2020-01-16 ENCOUNTER — Ambulatory Visit (INDEPENDENT_AMBULATORY_CARE_PROVIDER_SITE_OTHER): Payer: BC Managed Care – PPO | Admitting: Vascular Surgery

## 2020-01-16 ENCOUNTER — Encounter: Payer: Self-pay | Admitting: Vascular Surgery

## 2020-01-16 VITALS — BP 154/81 | HR 53 | Temp 97.3°F | Resp 18 | Ht 73.0 in | Wt 218.0 lb

## 2020-01-16 DIAGNOSIS — I83811 Varicose veins of right lower extremities with pain: Secondary | ICD-10-CM

## 2020-01-16 NOTE — Progress Notes (Signed)
Patient name: Timothy Kramer MRN: [id number] DOB: [birth year] Sex: male  REASON FOR CONSULT: Symptomatic varicose veins with pain right leg  HPI: Timothy Kramer is a 64 y.o. male, with a several month history of burning and stinging over a cluster of varicose veins in his right lateral leg.  He has no prior history of DVT.  He has no family history of varicose veins.  He occasionally does get some swelling on the right side.  He is a runner and sometimes it will get exacerbated by his running as well.  He has not worn compression stockings in the past.  He does not describe claudication symptoms.   Past Medical History:  Diagnosis Date  . Hypertension    Past Surgical History:  Procedure Laterality Date  . HOLEP-LASER ENUCLEATION OF THE PROSTATE WITH MORCELLATION N/A 05/17/2019   Procedure: HOLEP-LASER ENUCLEATION OF THE PROSTATE WITH MORCELLATION;  Surgeon: [name], MD;  Location: [facility] ORS;  Service: Urology;  Laterality: N/A;  . INNER EAR SURGERY    . KNEE CARTILAGE SURGERY Right     Family History  Problem Relation Age of Onset  . Dementia Mother     SOCIAL HISTORY: Social History   Socioeconomic History  . Marital status: Married    Spouse name: Not on file  . Number of children: Not on file  . Years of education: Not on file  . Highest education level: Not on file  Occupational History  . Not on file  Tobacco Use  . Smoking status: Never Smoker  . Smokeless tobacco: Never Used  Vaping Use  . Vaping Use: Never used  Substance and Sexual Activity  . Alcohol use: Yes    Comment: ocassionaly   . Drug use: Never  . Sexual activity: Not on file  Other Topics Concern  . Not on file  Social History Narrative  . Not on file   Social Determinants of Health   Financial Resource Strain:   . Difficulty of Paying Living Expenses: Not on file  Food Insecurity:   . Worried About Programme researcher, broadcasting/film/video in the Last Year: Not on file  . Ran Out of Food in the Last  Year: Not on file  Transportation Needs:   . Lack of Transportation (Medical): Not on file  . Lack of Transportation (Non-Medical): Not on file  Physical Activity:   . Days of Exercise per Week: Not on file  . Minutes of Exercise per Session: Not on file  Stress:   . Feeling of Stress : Not on file  Social Connections:   . Frequency of Communication with Friends and Family: Not on file  . Frequency of Social Gatherings with Friends and Family: Not on file  . Attends Religious Services: Not on file  . Active Member of Clubs or Organizations: Not on file  . Attends Banker Meetings: Not on file  . Marital Status: Not on file  Intimate Partner Violence:   . Fear of Current or Ex-Partner: Not on file  . Emotionally Abused: Not on file  . Physically Abused: Not on file  . Sexually Abused: Not on file    No Known Allergies  Current Outpatient Medications  Medication Sig Dispense Refill  . amLODipine (NORVASC) 10 MG tablet Take 10 mg by mouth daily.    Marland Kitchen atenolol (TENORMIN) 50 MG tablet Take 50 mg by mouth daily.    . Cholecalciferol (VITAMIN D3 PO) Take 1 tablet by mouth daily.  No current facility-administered medications for this visit.    ROS:   General:  No weight loss, Fever, chills  HEENT: No recent headaches, no nasal bleeding, no visual changes, no sore throat  Neurologic: No dizziness, blackouts, seizures. No recent symptoms of stroke or mini- stroke. No recent episodes of slurred speech, or temporary blindness.  Cardiac: No recent episodes of chest pain/pressure, no shortness of breath at rest.  No shortness of breath with exertion.  Denies history of atrial fibrillation or irregular heartbeat  Vascular: No history of rest pain in feet.  No history of claudication.  No history of non-healing ulcer, No history of DVT   Pulmonary: No home oxygen, no productive cough, no hemoptysis,  No asthma or wheezing  Musculoskeletal:  [ ]  Arthritis, [ ]  Low back  pain,  [ ]  Joint pain  Hematologic:No history of hypercoagulable state.  No history of easy bleeding.  No history of anemia  Gastrointestinal: No hematochezia or melena,  No gastroesophageal reflux, no trouble swallowing  Urinary: [ ]  chronic Kidney disease, [ ]  on HD - [ ]  MWF or [ ]  TTHS, [ ]  Burning with urination, [ ]  Frequent urination, [ ]  Difficulty urinating;   Skin: No rashes  Psychological: No history of anxiety,  No history of depression   Physical Examination  Vitals:   01/16/20 1307  BP: (!) 154/81  Pulse: (!) 53  Resp: 18  Temp: (!) 97.3 F (36.3 C)  TempSrc: Temporal  SpO2: 100%  Weight: 218 lb (98.9 kg)  Height: 6\' 1"  (1.854 m)    Body mass index is 28.76 kg/m.  General:  Alert and oriented, no acute distress HEENT: Normal Neck: No JVD Cardiac: Regular Rate and Rhythm Skin: No rash, prominent varicosity right lateral calf about 4 mm diameter Extremity Pulses:  2+ radial, brachial, femoral, absent dorsalis pedis, 2+ posterior tibial pulses bilaterally Musculoskeletal: No deformity or edema  Neurologic: Upper and lower extremity motor 5/5 and symmetric  DATA:  Patient had a venous reflux exam yesterday which showed diffuse reflux in the right greater saphenous vein 4 to 5 mm diameter  I repeated portions of his exam today with the SonoSite at the bedside and I was only able to see a vein diameter of up to about 3.8 m.  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  Patient's vein is slightly dilated but I do not think largely dilated enough to warrant laser ablation at this point.   PLAN: Patient was given a prescription today for lower extremity knee-high 20 to 30 mm compression stockings.  He will see if this provides symptomatic relief.  If his symptoms worsen or his varicosities worsen over time he will call back at some point in future.   , MD [facility] Office: [phone]

## 2020-02-12 DIAGNOSIS — H524 Presbyopia: Secondary | ICD-10-CM | POA: Diagnosis not present

## 2020-02-12 DIAGNOSIS — H52221 Regular astigmatism, right eye: Secondary | ICD-10-CM | POA: Diagnosis not present

## 2020-02-12 DIAGNOSIS — H5202 Hypermetropia, left eye: Secondary | ICD-10-CM | POA: Diagnosis not present

## 2020-02-27 ENCOUNTER — Encounter: Payer: BC Managed Care – PPO | Admitting: Vascular Surgery

## 2020-02-27 ENCOUNTER — Encounter (HOSPITAL_COMMUNITY): Payer: BC Managed Care – PPO

## 2020-04-17 DIAGNOSIS — S83241D Other tear of medial meniscus, current injury, right knee, subsequent encounter: Secondary | ICD-10-CM | POA: Diagnosis not present

## 2020-04-17 DIAGNOSIS — M25561 Pain in right knee: Secondary | ICD-10-CM | POA: Diagnosis not present

## 2020-04-24 DIAGNOSIS — Z23 Encounter for immunization: Secondary | ICD-10-CM | POA: Diagnosis not present

## 2020-04-24 DIAGNOSIS — Z13220 Encounter for screening for lipoid disorders: Secondary | ICD-10-CM | POA: Diagnosis not present

## 2020-04-24 DIAGNOSIS — Z125 Encounter for screening for malignant neoplasm of prostate: Secondary | ICD-10-CM | POA: Diagnosis not present

## 2020-04-24 DIAGNOSIS — I10 Essential (primary) hypertension: Secondary | ICD-10-CM | POA: Diagnosis not present

## 2020-04-24 DIAGNOSIS — Z0000 Encounter for general adult medical examination without abnormal findings: Secondary | ICD-10-CM | POA: Diagnosis not present

## 2020-04-29 DIAGNOSIS — S83281D Other tear of lateral meniscus, current injury, right knee, subsequent encounter: Secondary | ICD-10-CM | POA: Diagnosis not present

## 2020-10-02 DIAGNOSIS — Z20822 Contact with and (suspected) exposure to covid-19: Secondary | ICD-10-CM | POA: Diagnosis not present

## 2020-11-21 DIAGNOSIS — Z20822 Contact with and (suspected) exposure to covid-19: Secondary | ICD-10-CM | POA: Diagnosis not present

## 2021-01-04 IMAGING — DX DG KNEE 1-2V*R*
2 series · 2 of 2 positions shown · non-contrast
Comparison: June 25, 2014.

CLINICAL DATA: Chronic right knee pain without recent injury.

EXAM:
RIGHT KNEE - 1-2 VIEW

[dg knee 1-2 views right (1 of 2)]
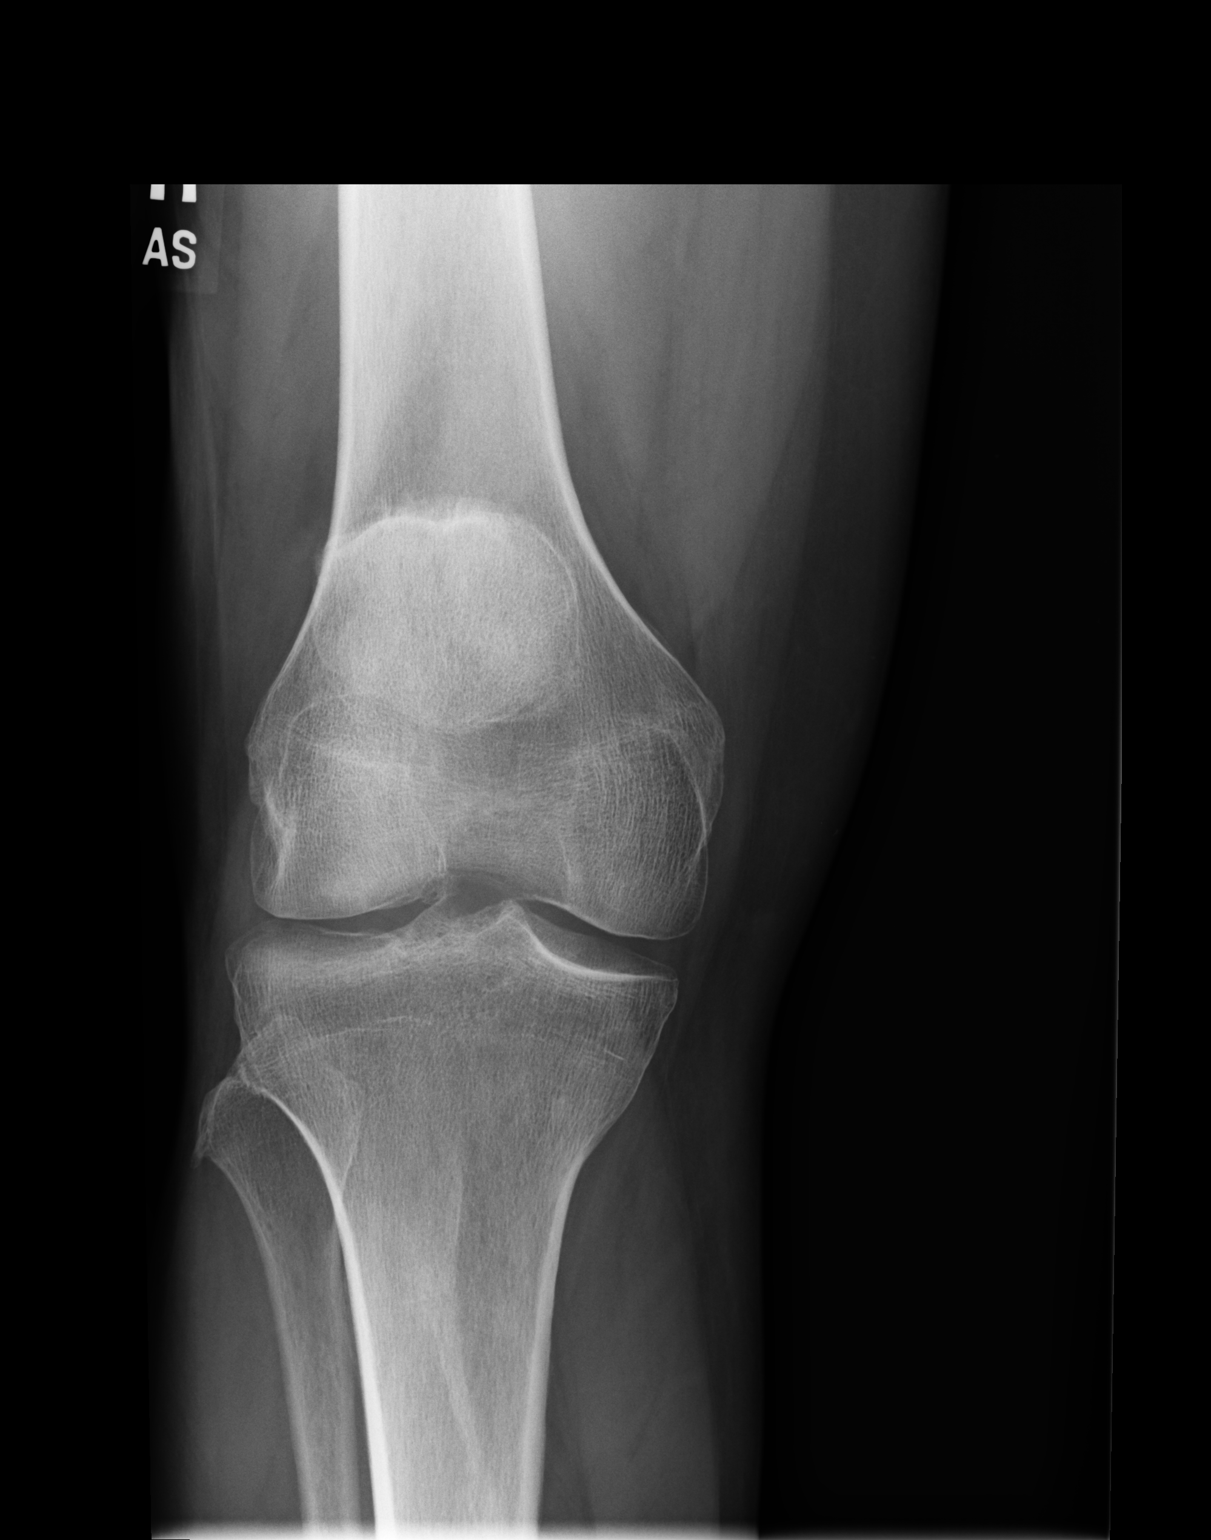

[dg knee 1-2 views right (2 of 2)]
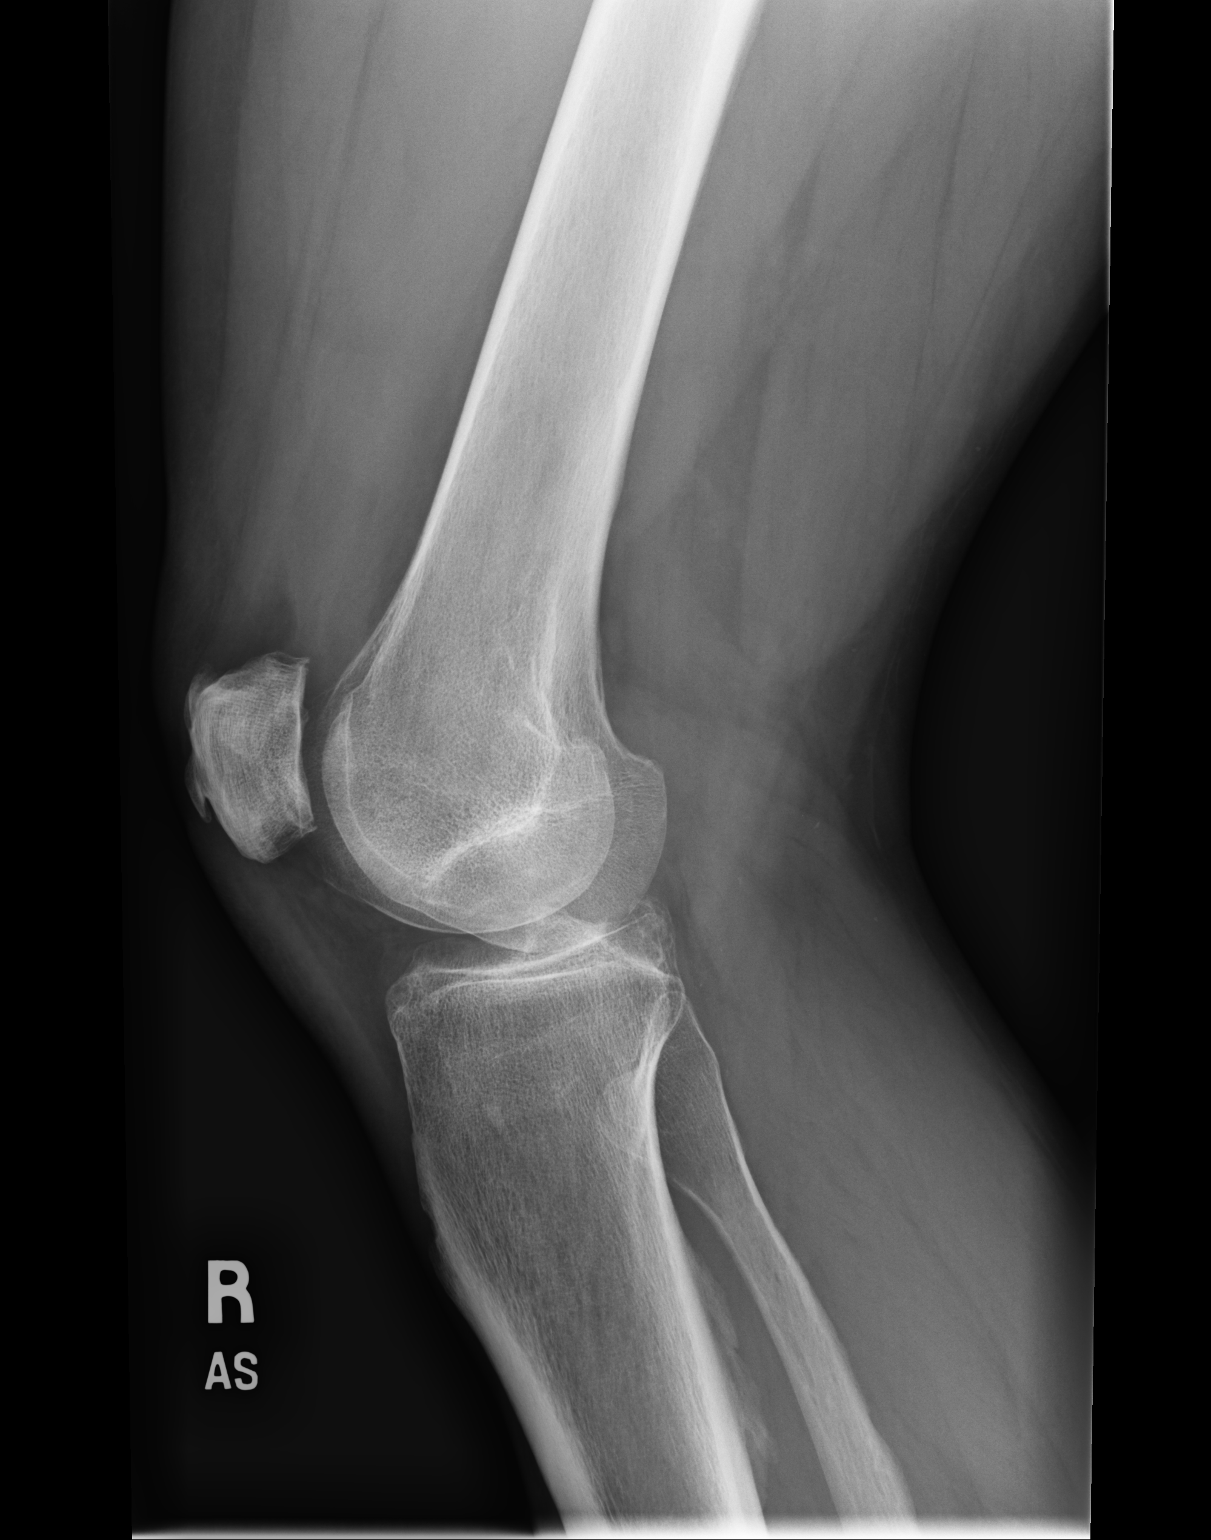

[2 of 2 positions shown; findings below may reference images not displayed]

FINDINGS: No evidence of fracture, dislocation, or joint effusion. No evidence
of arthropathy. Mild patellar spurring is noted. Soft tissues are
unremarkable.
IMPRESSION: Mild patellar spurring. No acute abnormality seen in the right knee.

## 2021-01-29 ENCOUNTER — Ambulatory Visit: Payer: BC Managed Care – PPO | Admitting: Podiatry

## 2021-01-29 ENCOUNTER — Encounter: Payer: Self-pay | Admitting: Podiatry

## 2021-01-29 ENCOUNTER — Other Ambulatory Visit: Payer: Self-pay

## 2021-01-29 DIAGNOSIS — L608 Other nail disorders: Secondary | ICD-10-CM | POA: Diagnosis not present

## 2021-01-29 DIAGNOSIS — L603 Nail dystrophy: Secondary | ICD-10-CM | POA: Diagnosis not present

## 2021-01-29 NOTE — Progress Notes (Signed)
  Subjective:  Patient ID: Timothy Kramer, male    DOB: 1955-06-24,  MRN: [id number]  Chief Complaint  Patient presents with   Toe Pain      (np) left foot great toe pain    65 y.o. male presents with the above complaint. History confirmed with patient.  Notes changes in his left great toenail where it feels often tingly and seems to be starting to get discolored.  His wife was being treated for nail fungus with a topical medication and he wonders if this could have spread from her  Objective:  Physical Exam: warm, good capillary refill, no trophic changes or ulcerative lesions, normal DP and PT pulses, and normal sensory exam. Left Foot: Dystrophy of the left hallux nail with thickening and mild brown discoloration.  No pain on the distal phalanx or manipulation of the IPJ on examination  Assessment:   1. Onychodystrophy      Plan:  Patient was evaluated and treated and all questions answered.  Discussed with him it is possible this is early onychomycosis.  I recommend we take a nail sample biopsy and I sent this to the lab for evaluation.  I will send him a MyChart message with the results when we get them back and we will consider topical treatment which I think he would respond well to compared to oral treatment.  If no fungus present and is only dystrophic a urea gel should help to thin and smooth the nail  Return if symptoms worsen or fail to improve, for i will message with lab results .

## 2021-01-30 DIAGNOSIS — H524 Presbyopia: Secondary | ICD-10-CM | POA: Diagnosis not present

## 2021-01-30 DIAGNOSIS — H52223 Regular astigmatism, bilateral: Secondary | ICD-10-CM | POA: Diagnosis not present

## 2021-02-05 ENCOUNTER — Encounter: Payer: Self-pay | Admitting: Podiatry

## 2021-03-23 DIAGNOSIS — R194 Change in bowel habit: Secondary | ICD-10-CM | POA: Diagnosis not present

## 2021-03-23 DIAGNOSIS — R1084 Generalized abdominal pain: Secondary | ICD-10-CM | POA: Diagnosis not present

## 2021-03-23 DIAGNOSIS — I10 Essential (primary) hypertension: Secondary | ICD-10-CM | POA: Diagnosis not present

## 2021-03-30 DIAGNOSIS — R760 Raised antibody titer: Secondary | ICD-10-CM | POA: Diagnosis not present

## 2021-05-01 DIAGNOSIS — Z13220 Encounter for screening for lipoid disorders: Secondary | ICD-10-CM | POA: Diagnosis not present

## 2021-05-01 DIAGNOSIS — Z0000 Encounter for general adult medical examination without abnormal findings: Secondary | ICD-10-CM | POA: Diagnosis not present

## 2021-05-01 DIAGNOSIS — N400 Enlarged prostate without lower urinary tract symptoms: Secondary | ICD-10-CM | POA: Diagnosis not present

## 2021-05-05 DIAGNOSIS — Z8619 Personal history of other infectious and parasitic diseases: Secondary | ICD-10-CM | POA: Diagnosis not present

## 2021-06-19 ENCOUNTER — Other Ambulatory Visit: Payer: Self-pay | Admitting: Internal Medicine

## 2021-06-19 ENCOUNTER — Ambulatory Visit
Admission: RE | Admit: 2021-06-19 | Discharge: 2021-06-19 | Disposition: A | Discharge status: Home / Self Care | Payer: BC Managed Care – PPO | Source: Ambulatory Visit | Attending: Internal Medicine | Admitting: Internal Medicine

## 2021-06-19 DIAGNOSIS — N529 Male erectile dysfunction, unspecified: Secondary | ICD-10-CM | POA: Diagnosis not present

## 2021-06-19 DIAGNOSIS — E291 Testicular hypofunction: Secondary | ICD-10-CM | POA: Diagnosis not present

## 2021-06-19 DIAGNOSIS — R222 Localized swelling, mass and lump, trunk: Secondary | ICD-10-CM

## 2021-06-25 ENCOUNTER — Other Ambulatory Visit: Payer: Self-pay | Admitting: Internal Medicine

## 2021-06-25 DIAGNOSIS — R9389 Abnormal findings on diagnostic imaging of other specified body structures: Secondary | ICD-10-CM

## 2021-06-25 DIAGNOSIS — R222 Localized swelling, mass and lump, trunk: Secondary | ICD-10-CM

## 2021-07-06 DIAGNOSIS — E291 Testicular hypofunction: Secondary | ICD-10-CM | POA: Diagnosis not present

## 2021-07-07 ENCOUNTER — Ambulatory Visit
Admission: RE | Admit: 2021-07-07 | Discharge: 2021-07-07 | Disposition: A | Discharge status: Home / Self Care | Payer: BC Managed Care – PPO | Source: Ambulatory Visit | Attending: Internal Medicine | Admitting: Internal Medicine

## 2021-07-07 ENCOUNTER — Other Ambulatory Visit: Payer: Self-pay | Admitting: Internal Medicine

## 2021-07-07 DIAGNOSIS — M545 Low back pain, unspecified: Secondary | ICD-10-CM | POA: Diagnosis not present

## 2021-07-07 DIAGNOSIS — N529 Male erectile dysfunction, unspecified: Secondary | ICD-10-CM | POA: Diagnosis not present

## 2021-07-07 DIAGNOSIS — M25559 Pain in unspecified hip: Secondary | ICD-10-CM | POA: Diagnosis not present

## 2021-07-07 DIAGNOSIS — E291 Testicular hypofunction: Secondary | ICD-10-CM | POA: Diagnosis not present

## 2021-07-07 DIAGNOSIS — G8929 Other chronic pain: Secondary | ICD-10-CM

## 2021-07-07 DIAGNOSIS — M25551 Pain in right hip: Secondary | ICD-10-CM | POA: Diagnosis not present

## 2021-08-04 DIAGNOSIS — M25551 Pain in right hip: Secondary | ICD-10-CM | POA: Diagnosis not present

## 2021-08-20 DIAGNOSIS — Z23 Encounter for immunization: Secondary | ICD-10-CM | POA: Diagnosis not present

## 2021-09-09 DIAGNOSIS — M25551 Pain in right hip: Secondary | ICD-10-CM | POA: Diagnosis not present

## 2021-09-09 DIAGNOSIS — S76011D Strain of muscle, fascia and tendon of right hip, subsequent encounter: Secondary | ICD-10-CM | POA: Diagnosis not present

## 2021-09-17 DIAGNOSIS — S76011D Strain of muscle, fascia and tendon of right hip, subsequent encounter: Secondary | ICD-10-CM | POA: Diagnosis not present

## 2021-09-17 DIAGNOSIS — M25551 Pain in right hip: Secondary | ICD-10-CM | POA: Diagnosis not present

## 2021-09-21 DIAGNOSIS — S76011D Strain of muscle, fascia and tendon of right hip, subsequent encounter: Secondary | ICD-10-CM | POA: Diagnosis not present

## 2021-09-21 DIAGNOSIS — M25551 Pain in right hip: Secondary | ICD-10-CM | POA: Diagnosis not present

## 2021-09-28 DIAGNOSIS — S83241A Other tear of medial meniscus, current injury, right knee, initial encounter: Secondary | ICD-10-CM | POA: Diagnosis not present

## 2021-09-28 DIAGNOSIS — S76011D Strain of muscle, fascia and tendon of right hip, subsequent encounter: Secondary | ICD-10-CM | POA: Diagnosis not present

## 2021-09-28 DIAGNOSIS — M25551 Pain in right hip: Secondary | ICD-10-CM | POA: Diagnosis not present

## 2021-10-05 DIAGNOSIS — M25551 Pain in right hip: Secondary | ICD-10-CM | POA: Diagnosis not present

## 2021-10-05 DIAGNOSIS — S76011D Strain of muscle, fascia and tendon of right hip, subsequent encounter: Secondary | ICD-10-CM | POA: Diagnosis not present

## 2021-10-12 DIAGNOSIS — M25551 Pain in right hip: Secondary | ICD-10-CM | POA: Diagnosis not present

## 2021-10-12 DIAGNOSIS — S76011D Strain of muscle, fascia and tendon of right hip, subsequent encounter: Secondary | ICD-10-CM | POA: Diagnosis not present

## 2021-10-22 DIAGNOSIS — M25551 Pain in right hip: Secondary | ICD-10-CM | POA: Diagnosis not present

## 2021-10-22 DIAGNOSIS — S76011D Strain of muscle, fascia and tendon of right hip, subsequent encounter: Secondary | ICD-10-CM | POA: Diagnosis not present

## 2021-10-26 DIAGNOSIS — S83241A Other tear of medial meniscus, current injury, right knee, initial encounter: Secondary | ICD-10-CM | POA: Diagnosis not present

## 2021-10-29 DIAGNOSIS — M25551 Pain in right hip: Secondary | ICD-10-CM | POA: Diagnosis not present

## 2021-10-29 DIAGNOSIS — S76011D Strain of muscle, fascia and tendon of right hip, subsequent encounter: Secondary | ICD-10-CM | POA: Diagnosis not present

## 2021-11-17 DIAGNOSIS — M25561 Pain in right knee: Secondary | ICD-10-CM | POA: Diagnosis not present

## 2021-11-23 DIAGNOSIS — Z03818 Encounter for observation for suspected exposure to other biological agents ruled out: Secondary | ICD-10-CM | POA: Diagnosis not present

## 2021-11-23 DIAGNOSIS — J069 Acute upper respiratory infection, unspecified: Secondary | ICD-10-CM | POA: Diagnosis not present

## 2021-12-16 DIAGNOSIS — S76011D Strain of muscle, fascia and tendon of right hip, subsequent encounter: Secondary | ICD-10-CM | POA: Diagnosis not present

## 2021-12-16 DIAGNOSIS — M25551 Pain in right hip: Secondary | ICD-10-CM | POA: Diagnosis not present

## 2021-12-24 DIAGNOSIS — S76011D Strain of muscle, fascia and tendon of right hip, subsequent encounter: Secondary | ICD-10-CM | POA: Diagnosis not present

## 2021-12-24 DIAGNOSIS — M25551 Pain in right hip: Secondary | ICD-10-CM | POA: Diagnosis not present

## 2022-01-14 DIAGNOSIS — S76011D Strain of muscle, fascia and tendon of right hip, subsequent encounter: Secondary | ICD-10-CM | POA: Diagnosis not present

## 2022-01-14 DIAGNOSIS — M25551 Pain in right hip: Secondary | ICD-10-CM | POA: Diagnosis not present

## 2022-02-01 DIAGNOSIS — H2513 Age-related nuclear cataract, bilateral: Secondary | ICD-10-CM | POA: Diagnosis not present

## 2022-02-01 DIAGNOSIS — H11153 Pinguecula, bilateral: Secondary | ICD-10-CM | POA: Diagnosis not present

## 2022-04-05 DIAGNOSIS — R109 Unspecified abdominal pain: Secondary | ICD-10-CM | POA: Diagnosis not present

## 2022-06-03 DIAGNOSIS — Z0000 Encounter for general adult medical examination without abnormal findings: Secondary | ICD-10-CM | POA: Diagnosis not present

## 2022-06-03 DIAGNOSIS — Z125 Encounter for screening for malignant neoplasm of prostate: Secondary | ICD-10-CM | POA: Diagnosis not present

## 2022-06-03 DIAGNOSIS — I10 Essential (primary) hypertension: Secondary | ICD-10-CM | POA: Diagnosis not present

## 2022-09-14 ENCOUNTER — Ambulatory Visit: Payer: BC Managed Care – PPO | Admitting: Podiatry

## 2022-09-14 DIAGNOSIS — L603 Nail dystrophy: Secondary | ICD-10-CM | POA: Diagnosis not present

## 2022-09-16 ENCOUNTER — Encounter: Payer: Self-pay | Admitting: Podiatry

## 2022-09-16 NOTE — Progress Notes (Signed)
  Subjective:  Patient ID: Timothy Kramer, male    DOB: 06/23/1955,  MRN: [id number]  Chief Complaint  Patient presents with   Nail Problem    Left great toenail is curving in on the sides and is thick and discolored    67 y.o. male presents with the above complaint. History confirmed with patient.  Returns for follow-up feels that its gotten worse since last time I saw him  Objective:  Physical Exam: warm, good capillary refill, no trophic changes or ulcerative lesions, normal DP and PT pulses, normal sensory exam, and pincer nail deformity with thickening and subungual debris and discoloration.  Assessment:   1. Onychodystrophy      Plan:  Patient was evaluated and treated and all questions answered.  We discussed onychodystrophy and regular pedicures to maintain this.  Does have some increased thickening and discoloration.  I recommended reculture of the nail plate.  Nail sample was sent to [facility], will let him know what the results of this show and we will treat accordingly.  Return if symptoms worsen or fail to improve.

## 2022-09-29 ENCOUNTER — Encounter: Payer: Self-pay | Admitting: Podiatry

## 2023-03-07 DIAGNOSIS — I10 Essential (primary) hypertension: Secondary | ICD-10-CM | POA: Diagnosis not present

## 2023-03-29 DIAGNOSIS — I10 Essential (primary) hypertension: Secondary | ICD-10-CM | POA: Diagnosis not present

## 2023-03-29 DIAGNOSIS — H9209 Otalgia, unspecified ear: Secondary | ICD-10-CM | POA: Diagnosis not present

## 2023-04-19 ENCOUNTER — Encounter: Payer: BC Managed Care – PPO | Admitting: Urology

## 2023-04-22 IMAGING — CR DG HIP (WITH OR WITHOUT PELVIS) 2-3V*R*
2 series · 2 of 2 positions shown · non-contrast
Comparison: None.

CLINICAL DATA: Chronic right hip pain.  No known injury.

EXAM:
DG HIP (WITH OR WITHOUT PELVIS) 2-3V RIGHT

[t hip ap right]
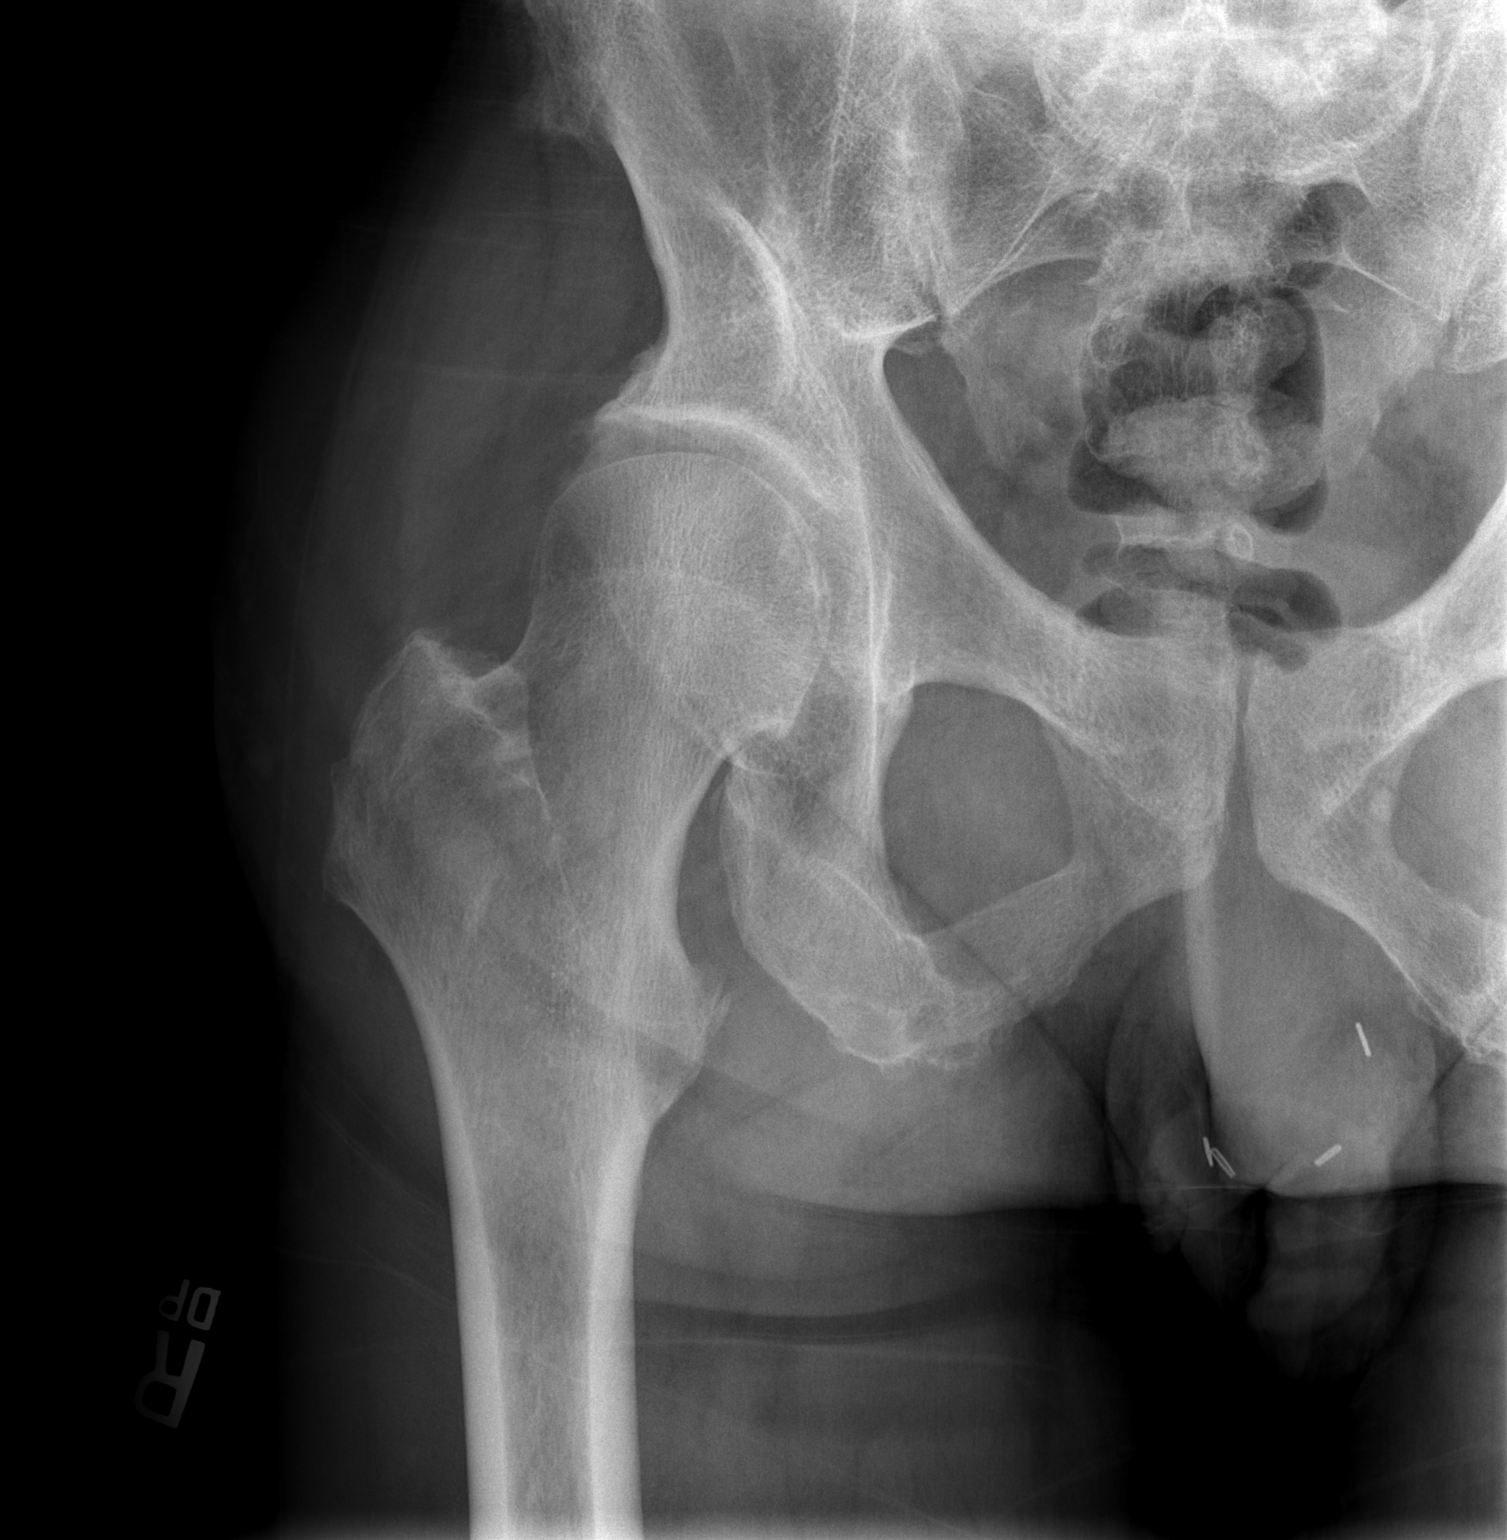

[t hip frog leg right]
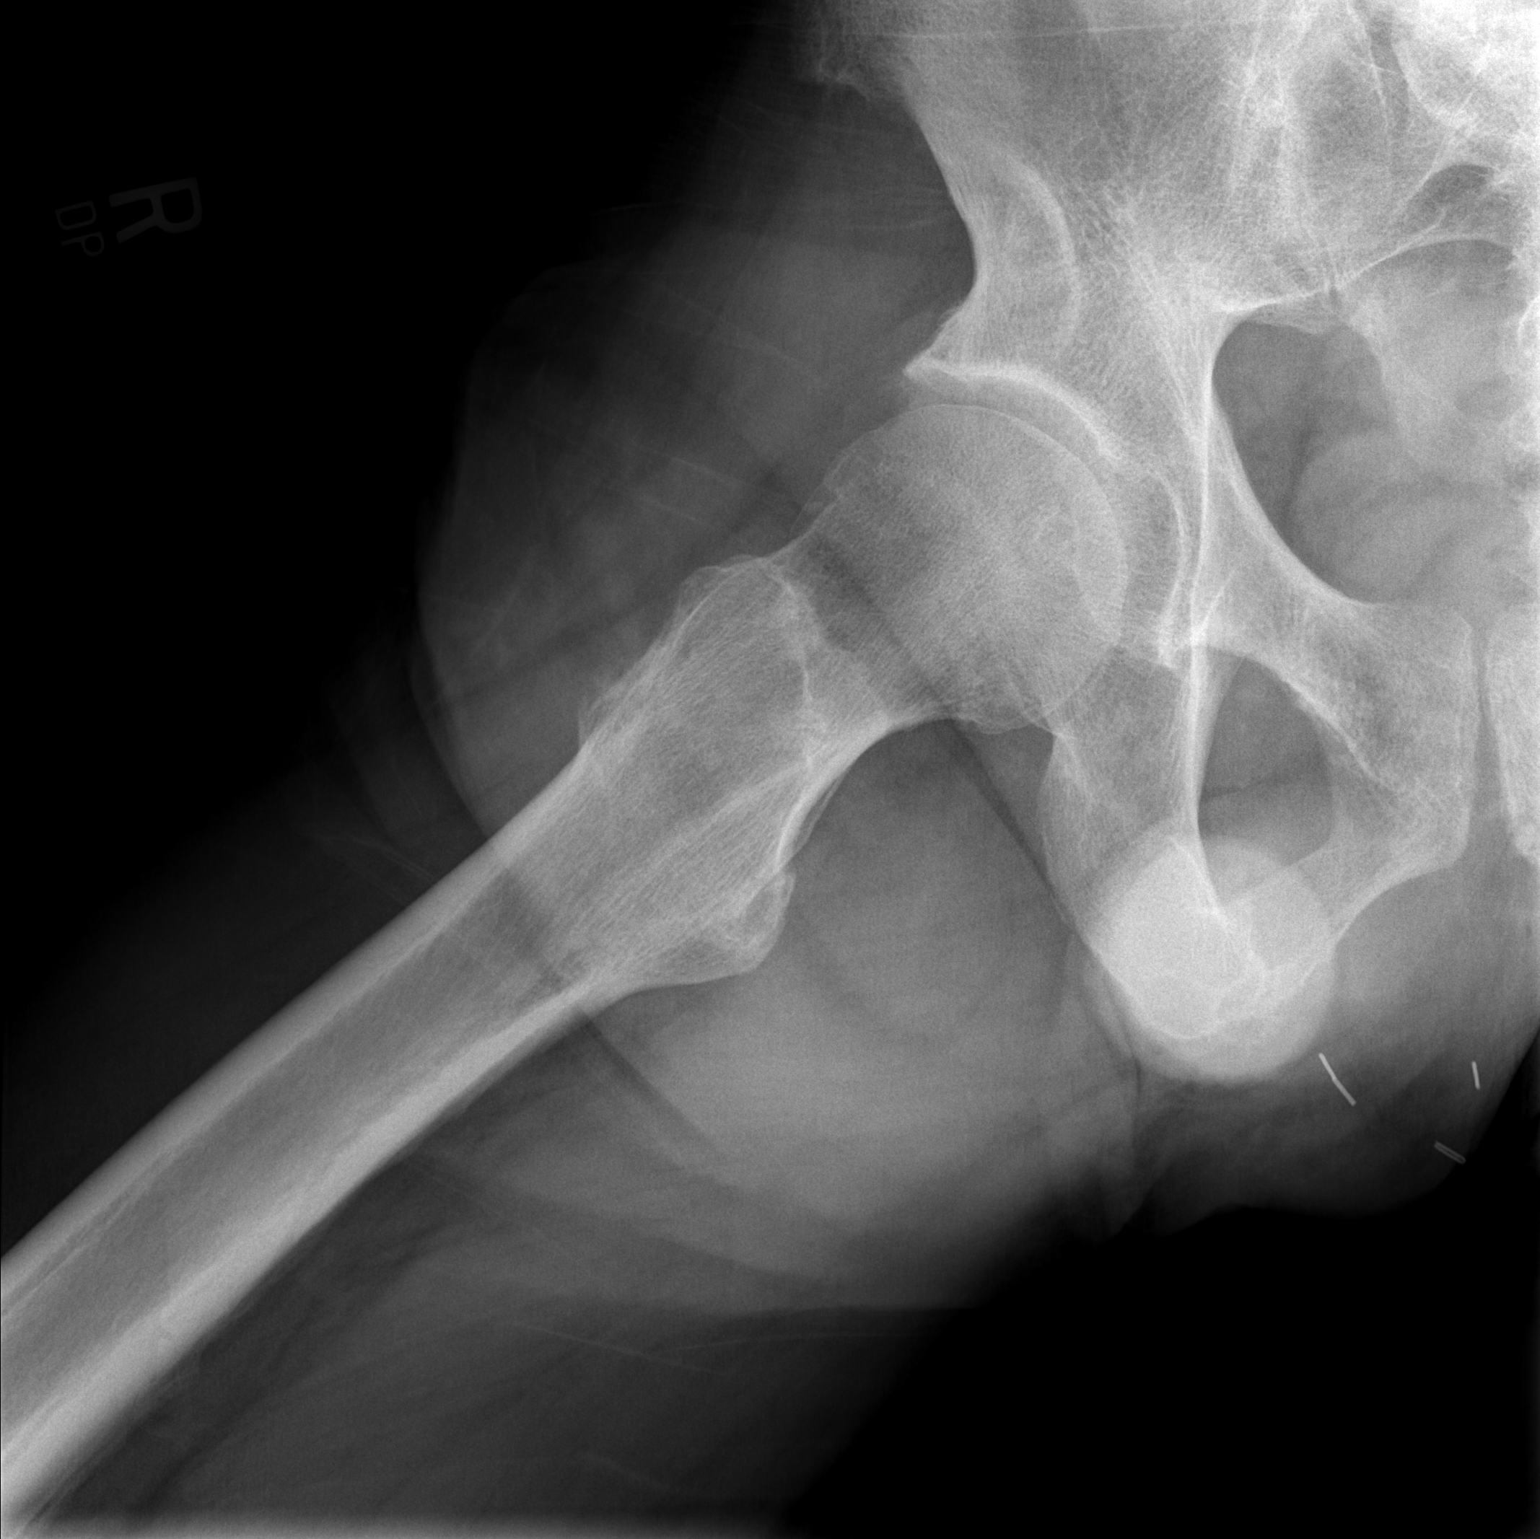

[2 of 2 positions shown; findings below may reference images not displayed]

FINDINGS: Mild femoral head and neck junction spur formation. No significant
joint space narrowing. Bilateral scrotal surgical clips.
IMPRESSION: Mild right hip degenerative changes.

## 2023-04-28 ENCOUNTER — Ambulatory Visit: Payer: BC Managed Care – PPO | Admitting: Urology

## 2023-04-28 VITALS — BP 188/93 | HR 56 | Ht 73.0 in | Wt 215.4 lb

## 2023-04-28 DIAGNOSIS — N528 Other male erectile dysfunction: Secondary | ICD-10-CM

## 2023-04-28 DIAGNOSIS — I10 Essential (primary) hypertension: Secondary | ICD-10-CM | POA: Diagnosis not present

## 2023-04-28 NOTE — Progress Notes (Signed)
 [name],acting as a scribe for Timothy Riis, MD.,have documented all relevant documentation on the behalf of Timothy Riis, MD,as directed by  Timothy Riis, MD while in the presence of Timothy Riis, MD.  04/28/23 9:00 AM   Timothy Kramer May 28, 1955 [id number]  Referring provider: Rexanne Ingle, MD [address] [place],  [place] [address]  Chief Complaint  Patient presents with   Establish Care   Erectile Dysfunction    HPI: 68 year-old male with a personal history of erectile dysfunction who presents today to reestablish care.   He was last seen in 2021 for a personal history of BPH with urinary obstruction. He is status post HoLEP in 2021. Postoperatively, he did extremely well and was advised to follow up as needed. He presents today for a different issue, erectile dysfunction.   Today, he reports that he has difficulty in both achieving and maintaining an erection. He is not currently on any ED medications. His medical comorbidities include hypertension.   He has tried Cialis in the past with limited success and has a prescription for Viagra but has not taken it due to a general objection to medications. He has engaged in a healthy lifestyle, including weight loss and regular exercise, but still experiences erectile dysfunction. He is interested in non-pharmacological interventions but is skeptical about their effectiveness.  He has a history of high blood pressure and is currently taking atenolol and amlodipine. He has been experimenting with medication adherence, noting that blood pressure increases when not taking atenolol.   SHIM     Row Name 04/28/23 0824         SHIM: Over the last 6 months:   How do you rate your confidence that you could get and keep an erection? Low     When you had erections with sexual stimulation, how often were your erections hard enough for penetration (entering your partner)? A Few Times (much less than half the  time)     During sexual intercourse, how often were you able to maintain your erection after you had penetrated (entered) your partner? A Few Times (much less than half the time)     During sexual intercourse, how difficult was it to maintain your erection to completion of intercourse? Very Difficult     When you attempted sexual intercourse, how often was it satisfactory for you? A Few Times (much less than half the time)       SHIM Total Score   SHIM 10               PMH: Past Medical History:  Diagnosis Date   Hypertension     Surgical History: Past Surgical History:  Procedure Laterality Date   HOLEP-LASER ENUCLEATION OF THE PROSTATE WITH MORCELLATION N/A 05/17/2019   Procedure: HOLEP-LASER ENUCLEATION OF THE PROSTATE WITH MORCELLATION;  Surgeon: Kramer Rosina, MD;  Location: [facility] ORS;  Service: Urology;  Laterality: N/A;   INNER EAR SURGERY     KNEE CARTILAGE SURGERY Right     Home Medications:  Allergies as of 04/28/2023   No Known Allergies      Medication List        Accurate as of April 28, 2023  9:00 AM. If you have any questions, ask your nurse or doctor.          amLODipine 10 MG tablet Commonly known as: NORVASC Take 10 mg by mouth daily.   atenolol 50 MG tablet Commonly known as: TENORMIN  Take 50 mg by mouth daily.   ibuprofen 800 MG tablet Commonly known as: ADVIL Take 800 mg by mouth every 6 (six) hours as needed.   OLIVE LEAF PO Take by mouth. Capsule daily   VITAMIN D3 PO Take 1 tablet by mouth daily.         Family History: Family History  Problem Relation Age of Onset   Dementia Mother     Social History:  reports that he has never smoked. He has never used smokeless tobacco. He reports current alcohol use. He reports that he does not use drugs.   Physical Exam: BP (!) 188/93   Pulse (!) 56   Ht 6' 1 (1.854 m)   Wt 215 lb 6 oz (97.7 kg)   BMI 28.42 kg/m   Constitutional:  Alert and oriented, No acute  distress. HEENT: [place] AT, moist mucus membranes.  Trachea midline, no masses. Neurologic: Grossly intact, no focal deficits, moving all 4 extremities. Psychiatric: Normal mood and affect.   Assessment & Plan:    1. Erectile dysfunction - We briefly discussed shockwave along with the data surrounding this and he was offered a referral but declined - We also had a lengthy discussion about lifestyle modification - Recommended trial of Viagra, which he has a prescription for, but he has not yet taken.  He already has a prescription and does not need one today. - We discussed the pathophysiology of erectile dysfunction today along with possible contributing factors. Discussed possible treatment options including PDE 5 inhibitors, vacuum erectile device, intracavernosal injection, MUSE, and placement of the inflatable or malleable penile prosthesis for refractory cases.  In terms of PDE 5 inhibitors, we discussed contraindications for this medication as well as common side effects. Patient was counseled on optimal use. All of his questions were answered in detail.  2. Hypertension - Poorly controlled - Counseled on the importance of blood pressure control - Blood pressure today was 188/93. He has not taken his amlodipine today - I feel that he needs to work closely with his primary care physician to get that under control - We discussed the short-term and long-term implications of poorly controlled blood pressure and its effect on erectile dysfunction as well   Return in about 1 month (around 05/29/2023), or if symptoms worsen or fail to improve.  I have reviewed the above documentation for accuracy and completeness, and I agree with the above.   Timothy Riis, MD    [facility] [address] [place], [place] [address] [phone]

## 2023-04-29 ENCOUNTER — Ambulatory Visit: Payer: BC Managed Care – PPO | Admitting: Urology

## 2023-05-03 DIAGNOSIS — H9201 Otalgia, right ear: Secondary | ICD-10-CM | POA: Diagnosis not present

## 2023-05-03 DIAGNOSIS — Z9889 Other specified postprocedural states: Secondary | ICD-10-CM | POA: Diagnosis not present

## 2023-06-15 DIAGNOSIS — H43811 Vitreous degeneration, right eye: Secondary | ICD-10-CM | POA: Diagnosis not present

## 2023-06-15 DIAGNOSIS — H1131 Conjunctival hemorrhage, right eye: Secondary | ICD-10-CM | POA: Diagnosis not present

## 2023-07-05 DIAGNOSIS — I10 Essential (primary) hypertension: Secondary | ICD-10-CM | POA: Diagnosis not present

## 2023-07-05 DIAGNOSIS — H9209 Otalgia, unspecified ear: Secondary | ICD-10-CM | POA: Diagnosis not present

## 2023-07-11 DIAGNOSIS — Z0000 Encounter for general adult medical examination without abnormal findings: Secondary | ICD-10-CM | POA: Diagnosis not present

## 2023-07-11 DIAGNOSIS — I10 Essential (primary) hypertension: Secondary | ICD-10-CM | POA: Diagnosis not present

## 2023-07-11 DIAGNOSIS — Z125 Encounter for screening for malignant neoplasm of prostate: Secondary | ICD-10-CM | POA: Diagnosis not present

## 2023-07-28 DIAGNOSIS — H43811 Vitreous degeneration, right eye: Secondary | ICD-10-CM | POA: Diagnosis not present

## 2023-09-29 DIAGNOSIS — Z9089 Acquired absence of other organs: Secondary | ICD-10-CM | POA: Diagnosis not present

## 2023-09-29 DIAGNOSIS — H9201 Otalgia, right ear: Secondary | ICD-10-CM | POA: Diagnosis not present

## 2023-10-31 DIAGNOSIS — H9209 Otalgia, unspecified ear: Secondary | ICD-10-CM | POA: Diagnosis not present

## 2023-10-31 DIAGNOSIS — R600 Localized edema: Secondary | ICD-10-CM | POA: Diagnosis not present

## 2023-10-31 DIAGNOSIS — R61 Generalized hyperhidrosis: Secondary | ICD-10-CM | POA: Diagnosis not present

## 2023-11-22 DIAGNOSIS — F418 Other specified anxiety disorders: Secondary | ICD-10-CM | POA: Diagnosis not present

## 2023-11-22 DIAGNOSIS — F3289 Other specified depressive episodes: Secondary | ICD-10-CM | POA: Diagnosis not present

## 2023-12-07 DIAGNOSIS — F418 Other specified anxiety disorders: Secondary | ICD-10-CM | POA: Diagnosis not present

## 2023-12-07 DIAGNOSIS — F3289 Other specified depressive episodes: Secondary | ICD-10-CM | POA: Diagnosis not present

## 2023-12-22 DIAGNOSIS — F418 Other specified anxiety disorders: Secondary | ICD-10-CM | POA: Diagnosis not present

## 2023-12-22 DIAGNOSIS — F3289 Other specified depressive episodes: Secondary | ICD-10-CM | POA: Diagnosis not present

## 2024-01-16 DIAGNOSIS — M25561 Pain in right knee: Secondary | ICD-10-CM | POA: Diagnosis not present

## 2024-02-09 DIAGNOSIS — H43811 Vitreous degeneration, right eye: Secondary | ICD-10-CM | POA: Diagnosis not present

## 2024-02-09 DIAGNOSIS — H04123 Dry eye syndrome of bilateral lacrimal glands: Secondary | ICD-10-CM | POA: Diagnosis not present

## 2024-02-09 DIAGNOSIS — H11153 Pinguecula, bilateral: Secondary | ICD-10-CM | POA: Diagnosis not present

## 2024-02-09 DIAGNOSIS — H2513 Age-related nuclear cataract, bilateral: Secondary | ICD-10-CM | POA: Diagnosis not present

## 2024-02-15 DIAGNOSIS — M1711 Unilateral primary osteoarthritis, right knee: Secondary | ICD-10-CM | POA: Diagnosis not present

## 2024-02-15 DIAGNOSIS — R262 Difficulty in walking, not elsewhere classified: Secondary | ICD-10-CM | POA: Diagnosis not present

## 2024-03-08 DIAGNOSIS — M1711 Unilateral primary osteoarthritis, right knee: Secondary | ICD-10-CM | POA: Diagnosis not present

## 2024-03-08 DIAGNOSIS — R262 Difficulty in walking, not elsewhere classified: Secondary | ICD-10-CM | POA: Diagnosis not present

## 2024-04-09 DIAGNOSIS — M25559 Pain in unspecified hip: Secondary | ICD-10-CM | POA: Diagnosis not present

## 2024-04-09 DIAGNOSIS — I10 Essential (primary) hypertension: Secondary | ICD-10-CM | POA: Diagnosis not present

## 2024-04-09 DIAGNOSIS — N1831 Chronic kidney disease, stage 3a: Secondary | ICD-10-CM | POA: Diagnosis not present
